# Patient Record
Sex: Female | Born: 1965 | Race: White | Hispanic: No
Health system: Southern US, Community
[De-identification: ages and names within clinical notes are randomized; demographics above are authoritative.]

## PROBLEM LIST (undated history)

## (undated) DIAGNOSIS — M545 Low back pain, unspecified: Secondary | ICD-10-CM

## (undated) DIAGNOSIS — Z87898 Personal history of other specified conditions: Secondary | ICD-10-CM

## (undated) DIAGNOSIS — F17200 Nicotine dependence, unspecified, uncomplicated: Secondary | ICD-10-CM

## (undated) DIAGNOSIS — R928 Other abnormal and inconclusive findings on diagnostic imaging of breast: Secondary | ICD-10-CM

## (undated) DIAGNOSIS — Z1211 Encounter for screening for malignant neoplasm of colon: Secondary | ICD-10-CM

## (undated) DIAGNOSIS — F909 Attention-deficit hyperactivity disorder, unspecified type: Secondary | ICD-10-CM

## (undated) DIAGNOSIS — F32A Depression, unspecified: Secondary | ICD-10-CM

## (undated) DIAGNOSIS — R0789 Other chest pain: Secondary | ICD-10-CM

## (undated) DIAGNOSIS — F329 Major depressive disorder, single episode, unspecified: Secondary | ICD-10-CM

## (undated) DIAGNOSIS — F1020 Alcohol dependence, uncomplicated: Secondary | ICD-10-CM

## (undated) DIAGNOSIS — Z9289 Personal history of other medical treatment: Secondary | ICD-10-CM

## (undated) DIAGNOSIS — F419 Anxiety disorder, unspecified: Secondary | ICD-10-CM

## (undated) HISTORY — DX: Attention-deficit hyperactivity disorder, unspecified type: F90.9

## (undated) HISTORY — DX: Depression, unspecified: F32.A

## (undated) HISTORY — DX: Nicotine dependence, unspecified, uncomplicated: F17.200

## (undated) HISTORY — DX: Personal history of other specified conditions: Z87.898

## (undated) HISTORY — DX: Personal history of other medical treatment: Z92.89

## (undated) HISTORY — DX: Other chest pain: R07.89

## (undated) HISTORY — DX: Major depressive disorder, single episode, unspecified: F32.9

## (undated) HISTORY — DX: Anxiety disorder, unspecified: F41.9

## (undated) HISTORY — DX: Low back pain, unspecified: M54.50

## (undated) HISTORY — DX: Other abnormal and inconclusive findings on diagnostic imaging of breast: R92.8

## (undated) HISTORY — DX: Low back pain: M54.5

## (undated) HISTORY — DX: Encounter for screening for malignant neoplasm of colon: Z12.11

## (undated) HISTORY — DX: Alcohol dependence, uncomplicated: F10.20

---

## 1995-01-29 HISTORY — PX: BUNIONECTOMY: SHX129

## 2005-03-28 ENCOUNTER — Inpatient Hospital Stay (HOSPITAL_COMMUNITY): Admission: EM | Admit: 2005-03-28 | Discharge: 2005-04-01 | Payer: Self-pay | Admitting: Internal Medicine

## 2005-11-29 ENCOUNTER — Inpatient Hospital Stay (HOSPITAL_COMMUNITY): Admission: AD | Admit: 2005-11-29 | Discharge: 2005-12-01 | Payer: Self-pay | Admitting: Psychiatry

## 2005-11-29 ENCOUNTER — Ambulatory Visit: Payer: Self-pay | Admitting: Psychiatry

## 2006-10-17 ENCOUNTER — Emergency Department (HOSPITAL_COMMUNITY): Admission: EM | Admit: 2006-10-17 | Discharge: 2006-10-17 | Payer: Self-pay | Admitting: Emergency Medicine

## 2006-11-29 DIAGNOSIS — Z87898 Personal history of other specified conditions: Secondary | ICD-10-CM

## 2006-11-29 HISTORY — DX: Personal history of other specified conditions: Z87.898

## 2009-06-28 DIAGNOSIS — R0789 Other chest pain: Secondary | ICD-10-CM

## 2009-06-28 DIAGNOSIS — Z9289 Personal history of other medical treatment: Secondary | ICD-10-CM

## 2009-06-28 HISTORY — PX: OTHER SURGICAL HISTORY: SHX169

## 2009-06-28 HISTORY — DX: Personal history of other medical treatment: Z92.89

## 2009-06-28 HISTORY — DX: Other chest pain: R07.89

## 2009-07-03 ENCOUNTER — Ambulatory Visit: Payer: Self-pay | Admitting: Occupational Medicine

## 2009-07-03 DIAGNOSIS — F329 Major depressive disorder, single episode, unspecified: Secondary | ICD-10-CM

## 2009-07-03 DIAGNOSIS — F32A Depression, unspecified: Secondary | ICD-10-CM | POA: Insufficient documentation

## 2009-07-03 DIAGNOSIS — R0602 Shortness of breath: Secondary | ICD-10-CM | POA: Insufficient documentation

## 2009-07-11 ENCOUNTER — Encounter: Payer: Self-pay | Admitting: Cardiology

## 2009-07-11 ENCOUNTER — Ambulatory Visit: Payer: Self-pay | Admitting: Cardiology

## 2009-07-11 DIAGNOSIS — R079 Chest pain, unspecified: Secondary | ICD-10-CM | POA: Insufficient documentation

## 2009-07-11 DIAGNOSIS — F17200 Nicotine dependence, unspecified, uncomplicated: Secondary | ICD-10-CM | POA: Insufficient documentation

## 2009-11-21 ENCOUNTER — Telehealth (INDEPENDENT_AMBULATORY_CARE_PROVIDER_SITE_OTHER): Payer: Self-pay | Admitting: *Deleted

## 2009-11-21 ENCOUNTER — Encounter: Payer: Self-pay | Admitting: Cardiology

## 2009-11-28 HISTORY — PX: TRANSTHORACIC ECHOCARDIOGRAM: SHX275

## 2009-12-06 ENCOUNTER — Ambulatory Visit: Payer: Self-pay

## 2009-12-06 ENCOUNTER — Encounter: Payer: Self-pay | Admitting: Cardiology

## 2009-12-06 ENCOUNTER — Ambulatory Visit (HOSPITAL_COMMUNITY): Admission: RE | Admit: 2009-12-06 | Discharge: 2009-12-06 | Payer: Self-pay | Admitting: Cardiology

## 2009-12-06 ENCOUNTER — Ambulatory Visit: Payer: Self-pay | Admitting: Cardiology

## 2009-12-12 ENCOUNTER — Telehealth: Payer: Self-pay | Admitting: Cardiology

## 2010-02-27 NOTE — Consult Note (Signed)
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

## 2010-02-27 NOTE — Assessment & Plan Note (Signed)
Summary: [facility]   Visit Type:  Initial Consult  CC:  Sob.  History of Present Illness: 45 yo female for evaluation of chest pain and dyspnea. No prior cardiac history. She states over the past 6 months she has had intermittent chest pain. It is described as a pressure in the substernal area without radiation. It can last 24 hours. It is not pleuritic, positional or related to food. It is not exertional. It typically occurs when she becomes angry. There is no associated symptoms. It resolved spontaneously. She also has feelings of dyspnea intermittently. These are unpredictable and occurs suddenly. She denies orthopnea, PND, pedal edema or syncope. She occasionally feels brief flutter but no sustained palpitations. Because of the above were asked to further evaluate.  Current Medications (verified): 1)  Prozac 40 Mg Caps (Fluoxetine Hcl) .... Once Daily 2)  Allegra 60 Mg Tabs (Fexofenadine Hcl) .... Once Daily  Allergies (verified): No Known Drug Allergies  Past History:  Past Medical History: Depression Previous alcoholism  Past Surgical History: Reviewed history from 07/03/2009 and no changes required. left foot- bunion removed  Family History: Reviewed history from 07/03/2009 and no changes required. mitral valve prolapse- mother Father with CABG at age 10  Social History: Reviewed history from 07/03/2009 and no changes required. Current Smoker 1/2 PPD X 20 Alcohol use-previous Drug use-no Married  Full Time 2 sons  Review of Systems       no fevers or chills, productive cough, hemoptysis, dysphasia, odynophagia, melena, hematochezia, dysuria, hematuria, rash, seizure activity, orthopnea, PND, pedal edema, claudication. Remaining systems are negative.   Vital Signs:  Patient profile:   45 year old female Height:      63 inches Weight:      113.75 pounds BMI:     20.22 Pulse rate:   91 / minute Pulse rhythm:   regular Resp:     18 per minute BP  sitting:   95 / 65  (right arm) Cuff size:   regular  Vitals Entered By: Vikki Ports (July 11, 2009 4:33 PM)  Physical Exam  General:  Well developed/well nourished in NAD Skin warm/dry; tatoos Patient not depressed No peripheral clubbing Back-normal HEENT-normal/normal eyelids Neck supple/normal carotid upstroke bilaterally; no bruits; no JVD; no thyromegaly chest - CTA/ normal expansion CV - RRR/normal S1 and S2; no murmurs, rubs or gallops;  PMI nondisplaced Abdomen -NT/ND, no HSM, no mass, + bowel sounds, no bruit 2+ femoral pulses, no bruits Ext-no edema, chords, 2+ DP Neuro-grossly nonfocal     EKG  Procedure date:  07/03/2009  Findings:      Sinus rhythm with normal axis. Probable lead reversal V1 and V2. No ST changes.  Impression & Recommendations:  Problem # 1:  CHEST PAIN UNSPECIFIED (ICD-786.50) Symptoms atypical. We'll schedule stress echocardiogram for risk stratification. If normal no further workup.  Problem # 2:  SHORTNESS OF BREATH (ICD-786.05) Schedule stress echocardiogram to quantify of the function as well. Orders: Stress Echo (Stress Echo)  Problem # 3:  TOBACCO ABUSE (ICD-305.1) Patient counseled on discontinuing.  Patient Instructions: 1)  Your physician recommends that you schedule a follow-up appointment in: AS NEEDED PENDING TEST RESULTS 2)  Your physician has requested that you have a stress echocardiogram. For further information please visit [email or web address]  Please follow instruction sheet as given.

## 2010-02-27 NOTE — Assessment & Plan Note (Signed)
Summary: SOB/CHEST DISCOMFORT   Vital Signs:  Patient Profile:   45 Years Old Female CC:      SOB, left shoulder/arm heaviness, dizziness Height:     63 inches Weight:      120 pounds O2 Sat:      98 % O2 treatment:    Room Air Temp:     97.0 degrees F oral Pulse rate:   99 / minute Resp:     16 per minute BP sitting:   115 / 72  (right arm) Cuff size:   regular  Pt. in pain?   yes    Location:   left shoulder    Type:       heaviness  Vitals Entered By: Lajean Saver RN (July 03, 2009 10:50 AM)                   Updated Prior Medication List: PROZAC 40 MG CAPS (FLUOXETINE HCL) once daily ALLEGRA 60 MG TABS (FEXOFENADINE HCL) once daily  Current Allergies: No known allergies History of Present Illness Chief Complaint: SOB, left shoulder/arm heaviness, dizziness History of Present Illness: 45yo WF with SOB intermittantly for the last few weeks and she is starting to get concerned.  Also with bilateral finger tingling for a few months and left shoulder pain for 2+ weeks.  No jaw pain or other radiation of pain.  Risk factors include 1/2ppd for 20 years and maternal hx of MVP.  She is also on Prozac for the last 10 years for anxiety and thinks that these symptoms may be due to stress at work over the last few months but she is unsure.  No true CP, but she thinks she gets more SOB than she should be.  Of note, she also had a normal Echo  ~10 years ago and normal recent blood work.    REVIEW OF SYSTEMS Constitutional Symptoms       Complains of fatigue.     Denies fever, chills, night sweats, weight loss, and weight gain.      Comments: dizziness Eyes       Denies change in vision, eye pain, eye discharge, glasses, contact lenses, and eye surgery. Ear/Nose/Throat/Mouth       Denies hearing loss/aids, change in hearing, ear pain, ear discharge, dizziness, frequent runny nose, frequent nose bleeds, sinus problems, sore throat, hoarseness, and tooth pain or bleeding.    Respiratory       Complains of shortness of breath.      Denies dry cough, productive cough, wheezing, asthma, bronchitis, and emphysema/COPD.  Cardiovascular       Denies murmurs, chest pain, and tires easily with exhertion.      Comments: dizziness, left shoulder heaviness   Gastrointestinal       Denies stomach pain, nausea/vomiting, diarrhea, constipation, blood in bowel movements, and indigestion. Genitourniary       Denies painful urination, kidney stones, and loss of urinary control. Neurological       Complains of tingling.      Denies paralysis, seizures, and fainting/blackouts.      Comments: left fingers Musculoskeletal       Denies muscle pain, joint pain, joint stiffness, decreased range of motion, redness, swelling, muscle weakness, and gout.  Skin       Denies bruising, unusual mles/lumps or sores, and hair/skin or nail changes.  Psych       Denies mood changes, temper/anger issues, anxiety/stress, speech problems, depression, and sleep problems. Other Comments: EKG  done and given to MD   Past History:  Past Medical History: Depression ECHO- normal  Past Surgical History: left foot- bunion removed  Family History: mitral valve prolapse- mother  Social History: Current Smoker 1/2 PPD X 20 Alcohol use-no Drug use-no Smoking Status:  current Drug Use:  no Physical Exam General appearance: well developed, well nourished, no acute distress Chest/Lungs: no rales, wheezes, or rhonchi bilateral, breath sounds equal without effort Heart: regular rate and  rhythm, no murmur Extremities: normal extremities Neurological: grossly intact and non-focal Skin: no obvious rashes or lesions Assessment New Problems: DEPRESSION (ICD-311) SHORTNESS OF BREATH (ICD-786.05) ANXIETY (ICD-300.00)   Plan New Orders: New Patient Level III [16109] Planning Comments:   Patient to monitor events in the next few days/weeks to see what exacerbates symptoms.  Will also schedule  outpatient cardiology visit for opinion on her shortness of breath and if it has a cardiac etiology.  Patient to go to the ER if worsening symptoms, severe chest pain or shortness of breath or worsening of symptoms.    The patient and/or caregiver has been counseled thoroughly with regard to medications prescribed including dosage, schedule, interactions, rationale for use, and possible side effects and they verbalize understanding.  Diagnoses and expected course of recovery discussed and will return if not improved as expected or if the condition worsens. Patient and/or caregiver verbalized understanding.   Orders Added: 1)  New Patient Level III [60454]

## 2010-02-27 NOTE — Progress Notes (Signed)
Summary: Echo results  Phone Note Call from Patient Call back at [phone]   Caller: Patient Summary of Call: Pt calling to get echo reuslts Initial call taken by: Judie Grieve,  December 12, 2009 9:09 AM  Follow-up for Phone Call        Phone Call Completed pt aware of stress test  and echo results. Follow-up by: Scherrie Bateman, LPN,  December 13, 2009 11:21 AM

## 2010-06-05 ENCOUNTER — Encounter: Payer: Self-pay | Admitting: Family Medicine

## 2010-06-05 ENCOUNTER — Ambulatory Visit (INDEPENDENT_AMBULATORY_CARE_PROVIDER_SITE_OTHER): Payer: BC Managed Care – PPO | Admitting: Family Medicine

## 2010-06-05 ENCOUNTER — Telehealth: Payer: Self-pay | Admitting: Family Medicine

## 2010-06-05 VITALS — BP 103/70 | HR 79 | Temp 98.1°F | Ht 62.0 in | Wt 114.0 lb

## 2010-06-05 DIAGNOSIS — R0600 Dyspnea, unspecified: Secondary | ICD-10-CM

## 2010-06-05 DIAGNOSIS — R0989 Other specified symptoms and signs involving the circulatory and respiratory systems: Secondary | ICD-10-CM

## 2010-06-05 DIAGNOSIS — F17200 Nicotine dependence, unspecified, uncomplicated: Secondary | ICD-10-CM

## 2010-06-05 DIAGNOSIS — M7918 Myalgia, other site: Secondary | ICD-10-CM | POA: Insufficient documentation

## 2010-06-05 DIAGNOSIS — R0609 Other forms of dyspnea: Secondary | ICD-10-CM

## 2010-06-05 DIAGNOSIS — IMO0001 Reserved for inherently not codable concepts without codable children: Secondary | ICD-10-CM

## 2010-06-05 DIAGNOSIS — R109 Unspecified abdominal pain: Secondary | ICD-10-CM

## 2010-06-05 LAB — POCT URINALYSIS DIPSTICK
Bilirubin, UA: NEGATIVE
Blood, UA: NEGATIVE
Glucose, UA: NEGATIVE
Nitrite, UA: NEGATIVE
Protein, UA: NEGATIVE
Spec Grav, UA: 1.05
pH, UA: 6.5

## 2010-06-05 NOTE — Telephone Encounter (Signed)
Pls request records from [facility] in [place] and from Dr. Christella Hartigan at [facility] in [place].  Thx

## 2010-06-05 NOTE — Patient Instructions (Signed)
Take ibuprofen 600mg  every 8 hours for the next 7d, then as needed. Apply heating pad for 20-30 min 1-2 times a day, stretch and remain fairly active. Call or return if the pain is worsening or not improved at all in 7-10d.

## 2010-06-05 NOTE — Assessment & Plan Note (Signed)
She is minimally symptomatic--mild DOE with exertion.  Spirometry normal today in office. Encouraged smoking cessation and she plans on proceeding with an employer-based cessation program that is free of charge.

## 2010-06-05 NOTE — Progress Notes (Signed)
Office Note 06/05/2010  CC:  Chief Complaint  Patient presents with  . Flank Pain    left side, x 2 weeks, worse yesterday    HPI:  Jamie Tucker is a 45 y.o. White female who is here to establish care, discuss left side pain. Patient's most recent primary MD: Brooks Sailors in [place], but majority of primary care has been done by her GYN--Dr. Christella Hartigan in [place]. Old records were not reviewed prior to or during today's visit. She complains of about 7 day history of constant pain in area near left iliac crest ("side"), with brief periods of acute worsening with bending or rotating her trunk.  No preceding injury recalled, although she now remembers that it began right after getting finished with a white water rafting trip.  The pain is 6/10 intensity, does not radiate, is not related to eating or BMs, and she has no nausea, fever, or appetite loss.  She does note some increase in urinary frequency lately but no dysuria, hematuria, or urgency.  She has some chronic mild stress incontinence--no changes lately.   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    Of note, she does want to quit smoking and has plans to take part in an employer based cessation program that is free.  However, she has a history of some mild/brief DOE and is interested in getting PFTs today.  Past Medical History  Diagnosis Date  . History of ETT 06/2009    Normal/No ischemia  . Atypical chest pain 06/2009  . Alcoholism     [facility] ICU admission 2007 for acute Alc intox,; also detox at [facility] 11/2005  . Tobacco dependence   . Depression   . Low back pain     Pt reports L/S x-ray that showed deg arth in the past    Past Surgical History  Procedure Date  . Exercise treadmill test 06/2009    No ischemia  . Transthoracic echocardiogram 11/2009    Normal except grade I diastolic  dysfunction    Family History  Problem Relation Age of Onset  . Mitral valve prolapse Mother   . Heart disease Mother     MVP  . Heart disease Father     CABG in his 61's, died age 52 of MI    History   Social History  . Marital Status: Married    Spouse Name: N/A    Number of Children: 2  . Years of Education: N/A   Occupational History  .  [employer]   Social History Main Topics  . Smoking status: Current Everyday Smoker -- 0.5 packs/day for 20 years  . Smokeless tobacco: Never Used  . Alcohol Use: No  . Drug Use: No  . Sexually Active: Not on file   Other Topics Concern  . 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    Outpatient Encounter Prescriptions as of 06/05/2010  Medication Sig Dispense Refill  . fexofenadine (ALLEGRA) 180 MG tablet Take 180 mg by mouth daily.        Marland Kitchen FLUoxetine (PROZAC) 40 MG capsule  Take 40 mg by mouth daily.        Marland Kitchen DISCONTD: fexofenadine (ALLEGRA) 60 MG tablet Take 60 mg by mouth daily.          No Known Allergies  ROS Review of Systems  Constitutional: Negative for fever, chills, appetite change and fatigue.  HENT: Negative for ear pain, congestion, sore throat, neck stiffness and dental problem.   Eyes: Negative for discharge, redness and visual disturbance.  Respiratory: Negative for cough, chest tightness, shortness of breath and wheezing.   Cardiovascular: Negative for chest pain, palpitations and leg swelling.  Gastrointestinal: Negative for nausea, vomiting, abdominal pain, diarrhea and blood in stool.  Genitourinary: Positive for frequency. Negative for dysuria, urgency, hematuria and difficulty urinating.  Musculoskeletal: Negative for myalgias, back pain, joint swelling and arthralgias.    Skin: Negative for pallor and rash.  Neurological: Negative for dizziness, speech difficulty, weakness and headaches.  Hematological: Negative for adenopathy. Does not bruise/bleed easily.  Psychiatric/Behavioral: Negative for confusion and sleep disturbance. The patient is not nervous/anxious.     PE; Blood pressure 103/70, pulse 79, temperature 98.1 F (36.7 C), temperature source Oral, height 5\' 2"  (1.575 m), weight 114 lb (51.71 kg), last menstrual period 05/13/2010, SpO2 99.00%. VITALS: Blood pressure 103/70, pulse 79, temperature 98.1 F (36.7 C), temperature source Oral, height 5\' 2"  (1.575 m), weight 114 lb (51.71 kg), last menstrual period 05/13/2010, SpO2 99.00%. Gen: alert, well appearing. HEENT: eyes without swelling, eryth, or drainage. Ear canals clear, TM's without erythema or loss of landmarks. Nose: clear.  Throat: no swelling, erythema, or exudate. Neck: no adenopathy, thyromegaly, or tenderness. Lungs: CTA bilat, nonlabored resps.  CV: RRR, no m/r/g Abd: soft, NT. EXT: no edema BACK: nontender, no deformity.  Mild discomfort to palpation over left iliac crest --soft tissues, not the crest itself.  No CVA tenderness.  HIP ROM intact without pain or stiffness.  No TTP of greater troch on left.  Mild discomfort with L spine extension, rotation, and lateral flexion to the right.  Straight leg raise negative.  LE strength and sensation intact bilat. Pertinent labs:  CC UA today was normal. Office spirometry today was NORMAL.  ASSESSMENT AND PLAN:   TOBACCO ABUSE She is minimally symptomatic--mild DOE with exertion.  Spirometry normal today in office. Encouraged smoking cessation and she plans on proceeding with an employer-based cessation program that is free of charge.  Myofascial muscle pain I reassured her that her symptoms and exam fit best with this (or possibly discogenic pain), and that a trial of NSAID is the best next step. Discussed ibuprofen 600mg  q8h x 7d,  then q8h prn.  Therapeutic expectations and side effect profile of medication discussed today.  Patient's questions answered. She'll call or return if no better in 10d or so, or if worsens before that. Will obtain old records.     Return if symptoms worsen or fail to improve.

## 2010-06-05 NOTE — Assessment & Plan Note (Signed)
I reassured her that her symptoms and exam fit best with this (or possibly discogenic pain), and that a trial of NSAID is the best next step. Discussed ibuprofen 600mg  q8h x 7d, then q8h prn.  Therapeutic expectations and side effect profile of medication discussed today.  Patient's questions answered. She'll call or return if no better in 10d or so, or if worsens before that. Will obtain old records.

## 2010-06-05 NOTE — Telephone Encounter (Signed)
Medical release forms faxed to [facility]

## 2010-06-07 ENCOUNTER — Encounter: Payer: Self-pay | Admitting: Family Medicine

## 2010-06-08 ENCOUNTER — Encounter: Payer: Self-pay | Admitting: Family Medicine

## 2010-06-15 NOTE — Discharge Summary (Signed)
Jamie Tucker, Jamie Tucker               ACCOUNT NO.:  [id number]   MEDICAL RECORD NO.:  [id number]          PATIENT TYPE:  INP   LOCATION:  5714                         FACILITY:  [facility]   PHYSICIAN:  Jonna L. Robb Matar, M.D.DATE OF BIRTH:  25-Dec-1965   DATE OF ADMISSION:  03/28/2005  DATE OF DISCHARGE:  04/01/2005                                 DISCHARGE SUMMARY   PRIMARY CARE PHYSICIAN:  Unassigned.   ALLERGIES:  None.   FINAL DIAGNOSES:  1.  Acute alcohol intoxication.  2.  Mild respiratory failure secondary to above.  3.  Chronic alcoholism.  4.  Hypokalemia.   HISTORY:  This 45 year old white female drinks every day, has occasional  pot.  Works at [employer].  Lives with her husband and two children.  She  had made plans to go to [facility] and, in fact, went over there after  having drunk a fifth right after work and became so intoxicated that she was  lethargic and having difficulty breathing.  When brought to the emergency  room, her alcohol level was 581.   HOSPITAL COURSE:  The patient was put in ICU for 24 hours as she was at very  high risk for needing to go on ventilatory support.  By the next day her  alcohol level was down to 280 and the patient was transferred to the regular  floor.  Her potassium of 3.2 was supplemented and she was put on an Ativan  detox protocol and received potassium supplements IM and full-load  magnesium.   Physical exam was unremarkable other than the decrease in mental status.   PLAN:  The patient will be discharged to [facility] on March 5.  Arrangements have been made.  Will continue her finishing up her Ativan  protocol there.      Jonna L. Robb Matar, M.D.  Electronically Signed     JLB/MEDQ  D:  03/31/2005  T:  04/01/2005  Job:  [id number]   cc:   [facility]

## 2010-06-15 NOTE — H&P (Signed)
Jamie Tucker, RAJU               ACCOUNT NO.:  [id number]   MEDICAL RECORD NO.:  [id number]          PATIENT TYPE:  IPS   LOCATION:  0501                          FACILITY:  [facility]   PHYSICIAN:  Geoffery Lyons, M.D.      DATE OF BIRTH:  05-01-1965   DATE OF ADMISSION:  11/29/2005  DATE OF DISCHARGE:                         PSYCHIATRIC ADMISSION ASSESSMENT   IDENTIFYING INFORMATION:  This is a voluntary admission to the services of  Dr. Geoffery Lyons.  This is a 45 year old married white female.  Apparently,  she passed out on Halloween.  She had started drinking again the previous  Saturday and had not been taking her Prozac consistently.  Her first  admission for alcohol intoxication was March 28, 2005.  She presented to the  emergency department.  She was noted to have been drinking every day with  occasional marijuana.  She came in intoxicated.  She was lethargic and  having difficulty breathing.  Her admission at that time showed her alcohol  level to be 581.  She was put in the ICU as she was at very high risk for  needing ventilatory support.  The following day, her alcohol level had  decreased to 280 and she was transferred to the floor.  She left and was  immediately enrolled at [facility].  She was there until April 29, 2005  and states that she stayed sober for approximately 90 days.  Apparently,  since then, she has had some alcohol intake but last Saturday she began  drinking heavily.  Apparently, her mother's stomach cancer has reappeared.  She was worried, etc.  Her alcohol level on admission was 378.  She had no  other abnormal lab values and she is here for detoxification.   SOCIAL HISTORY:  She is a high Garment/textile technologist in 1986.  She has been  married once.  She has a 48-year-old son and an 74-month-old son.  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   FAMILY HISTORY:  Her maternal grandfather was alcoholic and her  maternal  aunt was also an alcoholic.   ALCOHOL/DRUG HISTORY:  At age 38, she began social drinking.  She states  that three years ago, for reasons unknown to her, her alcohol intake  increased.  She states approximately five years ago she noticed that she was  more emotional, edgy, irritable and was started on Prozac.   PRIMARY CARE PHYSICIAN:  Dr. Christella Hartigan.   MEDICAL PROBLEMS:  No problems.   MEDICATIONS:  Prozac 20 mg, Allegra q.d. for allergies.   ALLERGIES:  No known drug allergies.   PHYSICAL EXAMINATION:  As already stated, her alcohol level was 378 in the  ED.  She was medically cleared.  Her vital signs on admission show that she  is 63 inches tall.  She weighs 110 pounds.  Her temperature is 97.2, blood  pressure 121/83, respirations 20 and pulse 96.  She has some old scars on  her right arm from cooking injuries.   MENTAL STATUS EXAM:  Today, she  is alert and oriented x3.  She is casually  groomed and dressed, adequately nourished.  Her speech is not pressured.  Her mood is appropriate to the situation.  Her thought processes are clear,  rational and goal-oriented.  Judgment and insight are intact.  Concentration  and memory are intact.  Intelligence is at least average.  She specifically  denies suicidal or homicidal ideation.  She denies auditory or visual  hallucinations.  In talking with her, a Mood Disorders Questionnaire was  administered and it is suggestive for an underlying mood disorder.   DIAGNOSES:  AXIS I:  Alcohol dependence.  Depression and anxiety, treated  with Prozac for five years.  Rule out mood disorder.  AXIS II:  Deferred.  AXIS III:  None.  AXIS IV:  Severe.  She did have a DWI in September of 2006.  She lost her  license in May for one year.  AXIS V:  50.   PLAN:  To admit for safety and stabilization, to help her safely withdrawal.  Towards that end, she was put on the low dose Librium protocol.  She has not  been taking her Prozac on a  regular basis.  She will discuss with Dr. Katrinka Blazing  whether she needs to take Campral, Lamictal and the patient, herself, asked  about Antabuse.  She does have an AA sponsor and can be followed as an  outpatient.      Mickie Leonarda Salon, P.A.-C.      Geoffery Lyons, M.D.  Electronically Signed    MD/MEDQ  D:  11/30/2005  T:  11/30/2005  Job:  [id number]

## 2010-06-15 NOTE — Discharge Summary (Signed)
Jamie Tucker, Jamie Tucker               ACCOUNT NO.:  [id number]   MEDICAL RECORD NO.:  [id number]          PATIENT TYPE:  IPS   LOCATION:  0501                          FACILITY:  [facility]   PHYSICIAN:  Geoffery Lyons, M.D.      DATE OF BIRTH:  08/20/1965   DATE OF ADMISSION:  11/29/2005  DATE OF DISCHARGE:  12/01/2005                               DISCHARGE SUMMARY   CHIEF COMPLAINT AND PRESENT ILLNESS:  This was the first admission to  [facility] for this 45 year old married white female.  She passed out on Halloween.  Started drinking again the previous  Saturday.  Not been taking Prozac consistently.  Had been drinking every  day with occasional marijuana use and daily she was intoxicated.  She  was lethargic and having difficulty breathing.  Alcohol level was 581.  She was put in the ICU.  The next day, alcohol level was 280.  Transferred to the floor.  She left and was immediately enrolled at  [facility].  She was there until April 29, 2005.  This episode of  intoxication was March 28, 2005.  Stayed sober for 90 days.  Since then,  she has had alcohol occasionally.  Lost father as she began drinking  heavily.  Trigger was her mother's stomach cancer reappeared.  She was  worried.   PAST PSYCHIATRIC HISTORY:  She had been in [facility] once she was  found to have the alcohol level of 581 back in March.   MEDICAL HISTORY:  Noncontributory.   MEDICATIONS:  Prozac 20 mg per day, Allegra for allergies.   PHYSICAL EXAMINATION:  Performed and failed to show any acute findings.   LABORATORY DATA:  Sodium 140, potassium 3.5, glucose 107, BUN 11,  creatinine 0.6.  Liver enzymes with SGOT 24, SGPT 18.  CBC with white  blood cells 8.8, hemoglobin 13.6.  Drug screening negative for  substances of abuse.   MENTAL STATUS EXAM:  Alert, cooperative female.  Casually groomed and  dressed.  Speech was normal in rate, tempo and production.  Mood  anxious.  Affect anxious.   Thought processes logical, clear and goal-  oriented.  No delusions.  No suicidal or homicidal ideation.  No  hallucinations.  Cognition was well-preserved.   ADMISSION DIAGNOSES:  AXIS I:  Alcohol dependence.  Depressive disorder  not otherwise specified.  AXIS II:  No diagnosis.  AXIS III:  No diagnosis.  AXIS IV:  Moderate.  AXIS V:  GAF upon admission 35-40; highest GAF in the last year 65-70.   HOSPITAL COURSE:  She was admitted.  She was started in individual and  group psychotherapy.  She was detoxified with Librium.  She was placed  on Campral.  She endorsed, as already stated, that she was in [facility] for 30 days, 60 days abstinence, relapsed.  Very upset.  Worse  weekend, vodka, feeling miserable after she drank.  She apparently,  seven years ago, had a depressive episode, was irritable, labile,  emotional.  Started drinking when she was 19.  Occasional social  drinker.  Three  years ago, switched to every day with increased use,  then switched to vodka, drinking a fifth with tremors and nausea.  Family history of addictions.  There was a family session with the  husband.  They addressed relapse prevention.  They talked about her use  and the husband's concern that she was not going to be compliant with  treatment.  On the other hand, she claimed that she was going to change  her lifestyle and was going to work on long-term abstinence.  By  December 01, 2005, she was in full contact with reality.  There were no  overt symptoms of withdrawal.  She was committed to abstaining.  She was  willing to pursue treatment, see a counselor, go to [facility].  We went  ahead and discharged to outpatient follow-up.   DISCHARGE DIAGNOSES:  AXIS I:  Alcohol dependence.  Depressive disorder  not otherwise specified.  AXIS II:  No diagnosis.  AXIS III:  No diagnosis.  AXIS IV:  Moderate.  AXIS V:  GAF upon discharge 50-55.   DISCHARGE MEDICATIONS:  1. Campral 333 mg, 2 tabs three  times a day.  2. Lamictal 25 mg per day.  3. Antabuse 250 mg per day.   FOLLOWUP:  [facility].      Geoffery Lyons, M.D.  Electronically Signed     IL/MEDQ  D:  12/30/2005  T:  12/31/2005  Job:  [id number]

## 2010-06-15 NOTE — H&P (Signed)
Jamie Tucker, Jamie Tucker               ACCOUNT NO.:  [id number]   MEDICAL RECORD NO.:  [id number]          PATIENT TYPE:  INP   LOCATION:  2302                         FACILITY:  [facility]   PHYSICIAN:  Renato Battles, M.D.     DATE OF BIRTH:  04/02/1965   DATE OF ADMISSION:  03/28/2005  DATE OF DISCHARGE:                                HISTORY & PHYSICAL   PRIMARY CARE PHYSICIAN:  Unassigned.   REASON FOR ADMISSION:  Acute alcohol intoxication.   HISTORY OF PRESENT ILLNESS:  The patient is a 45 year old white female who  has been drinking alcohol every day over the last few months. Today she was  at home drinking and her and her husband were preparing to take her to  [facility] for detoxification program, 28 days. While the husband was  preparing her gear to take her there she drank a whole fifth of vodka on top  of what she was drinking during the daytime. Upon arrival to [facility]  she was hard to arouse, she had frothy secretions coming from her mouth, and  she was saturating in the middle 80s, so she was sent to the emergency  department for evaluation. In the emergency department she was found to  protect her airways. However, she is stuporous and not responsive.   REVIEW OF SYSTEMS:  Unobtainable secondary to mental status.   PAST MEDICAL HISTORY:  None.   PAST SURGICAL HISTORY:  None.   FAMILY HISTORY:  None.   SOCIAL HISTORY:  She smokes one pack per day; she has been doing so for 10  years. Drinks on a daily basis with occasional marijuana use but no drug use  otherwise. She is a Teacher, adult education at [employer]. Lives with her husband  and two children. The younger of them is only 55 months old.   ALLERGIES:  No known drug allergies.   HOME MEDICATIONS:  None.   PHYSICAL EXAMINATION:  GENERAL:  The patient is semicomatose. Her only  response is opening her eyes to painful stimuli.  HEENT:  Head is atraumatic, normocephalic. Pupils equal, round, and reactive  to light and com.  NECK:  No lymphadenopathy, no thyromegaly, no JVD.  CHEST:  Clear to auscultation bilaterally. No wheezing, rales, or rhonchi.  HEART:  Regular rate and rhythm.  ABDOMEN:  Soft, nontender, not distended. No organomegaly. Normoactive bowel  sounds.  EXTREMITIES:  No cyanosis, edema, or clubbing.   STUDIES:  CBC showed normal white count, hemoglobin and platelets.   Electrolytes showed low potassium at 3.2, elevated glucose at 128, otherwise  normal.   Drug screen was negative. Alcohol level was very elevated at a whopping 581.  Pregnancy test was negative. Urinalysis was negative. ABG showed pH of 7.3,  pCO2 of 48, pO2 of 155.   ASSESSMENT:  1.  Acute alcohol intoxication.  2.  Chronic alcohol abuse.  3.  Hypokalemia.   PLAN:  1.  Admit to ICU for close monitoring.  2.  Frequent monitoring of the patient's mental and respiratory status to      detect any immediate need  for intubation for airway protection.  3.  Start thiamine and folate IV.  4.  Start IV fluids.  5.  Replace potassium via IV fluids.  6.  Check magnesium level and liver functions.  7.  Consult chemical dependence nurse for detoxification options.      Renato Battles, M.D.  Electronically Signed     SA/MEDQ  D:  03/28/2005  T:  03/29/2005  Job:  [id number]

## 2010-10-02 ENCOUNTER — Encounter: Payer: Self-pay | Admitting: Family Medicine

## 2010-10-02 ENCOUNTER — Ambulatory Visit (INDEPENDENT_AMBULATORY_CARE_PROVIDER_SITE_OTHER): Payer: BC Managed Care – PPO | Admitting: Family Medicine

## 2010-10-02 DIAGNOSIS — R5383 Other fatigue: Secondary | ICD-10-CM | POA: Insufficient documentation

## 2010-10-02 DIAGNOSIS — R5381 Other malaise: Secondary | ICD-10-CM

## 2010-10-02 DIAGNOSIS — R05 Cough: Secondary | ICD-10-CM

## 2010-10-02 DIAGNOSIS — R059 Cough, unspecified: Secondary | ICD-10-CM

## 2010-10-02 LAB — COMPREHENSIVE METABOLIC PANEL
ALT: 11 U/L (ref 0–35)
AST: 15 U/L (ref 0–37)
Albumin: 4.4 g/dL (ref 3.5–5.2)
CO2: 23 mEq/L (ref 19–32)
Calcium: 9.8 mg/dL (ref 8.4–10.5)
Chloride: 105 mEq/L (ref 96–112)
Creat: 0.62 mg/dL (ref 0.50–1.10)
Potassium: 4.4 mEq/L (ref 3.5–5.3)
Sodium: 137 mEq/L (ref 135–145)

## 2010-10-02 NOTE — Assessment & Plan Note (Signed)
Will check CBC, TSH, CMET, ESR. Her husband will observe for any sleep apnea. Recommended decreasing caffeine intake, start exercising, stop smoking.

## 2010-10-02 NOTE — Progress Notes (Signed)
OFFICE VISIT  10/02/2010   CC:  Chief Complaint  Patient presents with  . Sore Throat    x 4 days, fatigue     HPI:    Patient is a 45 y.o. Caucasian female who presents for cough and fatigue. Fatigue has been present for years, some excessive daytime sleepiness but no known snoring or apneic spells. Worse fatigue last 4d, with onset of clear runny nose, some cough and feeling of chest tightness/congestion, and forehead/retroorbital HAs. Some vague pain in neck around submandibular glands bilat, without ST or anterior neck pain. No rash, no fever, no myalgias. She has long hx of intermittent lumbar back pain but this hasn't bothered her much lately.  No weight loss. Question of hx of anemia in distant past, was told to take OTC MVI once daily and then never went back for recheck of these labs (done via dermatologist per pt report).  Says menses are regular and NOT heavy, actually getting lighter.  Past Medical History  Diagnosis Date  . History of ETT 06/2009    Normal/No ischemia  . Atypical chest pain 06/2009  . Alcoholism     [facility] ICU admission 2007 for acute Alc intox,; also detox at [facility] 11/2005  . Tobacco dependence   . Depression   . Low back pain     Pt reports L/S x-ray that showed deg arth in the past  . History of mammogram     Normal, most recent 09/2007 (through her OB/GYN)  . History of abnormal Pap smear 11/2006    ASC-US, High risk HPV negative.  Follow up paps normal.    Past Surgical History  Procedure Date  . Exercise treadmill test 06/2009    No ischemia  . Transthoracic echocardiogram 11/2009    Normal except grade I diastolic dysfunction  . Bunionectomy 1997    Left foot    Outpatient Prescriptions Prior to Visit  Medication Sig Dispense Refill  . fexofenadine (ALLEGRA) 180 MG tablet Take 180 mg by mouth daily.        Marland Kitchen FLUoxetine (PROZAC) 40 MG capsule Take 40 mg by mouth daily.        L-methylfolate qd  No Known Allergies  ROS As per  HPI  PE: Blood pressure 102/67, pulse 80, temperature 98.2 F (36.8 C), temperature source Oral, weight 113 lb (51.256 kg). VS: noted--normal. Gen: alert, NAD, NONTOXIC APPEARING. HEENT: eyes without injection, drainage, or swelling.  Ears: EACs clear, TMs with normal light reflex and landmarks.  Nose: Clear rhinorrhea, with some dried, crusty exudate adherent to mildly injected mucosa.  No purulent d/c.  No paranasal sinus TTP.  No facial swelling.  Throat and mouth without focal lesion.  No pharyngial swelling, erythema, or exudate.   Neck: supple, no LAD.  No mass, no TM. LUNGS: CTA bilat, nonlabored resps.   CV: RRR, no m/r/g. EXT: no c/c/e SKIN: no rash  LABS:  none  IMPRESSION AND PLAN:  Cough URI + mild smoker's bronchitis. Discussed smoking cessation, symptomatic care. With hx of fatigue will check CXR--ordered today. PFTs last visit were fine.  Fatigue Will check CBC, TSH, CMET, ESR. Her husband will observe for any sleep apnea. Recommended decreasing caffeine intake, start exercising, stop smoking.     FOLLOW UP: Return in about 6 weeks (around 11/13/2010).

## 2010-10-02 NOTE — Assessment & Plan Note (Signed)
URI + mild smoker's bronchitis. Discussed smoking cessation, symptomatic care. With hx of fatigue will check CXR--ordered today. PFTs last visit were fine.

## 2010-10-03 LAB — CBC WITH DIFFERENTIAL/PLATELET
Eosinophils Absolute: 0.3 10*3/uL (ref 0.0–0.7)
HCT: 38.4 % (ref 36.0–46.0)
Hemoglobin: 12.6 g/dL (ref 12.0–15.0)
Lymphocytes Relative: 35 % (ref 12–46)
Lymphs Abs: 2.4 10*3/uL (ref 0.7–4.0)
MCH: 29.9 pg (ref 26.0–34.0)
MCHC: 32.8 g/dL (ref 30.0–36.0)
Monocytes Relative: 8 % (ref 3–12)
Neutrophils Relative %: 52 % (ref 43–77)

## 2010-11-08 LAB — DIFFERENTIAL
Basophils Absolute: 0
Eosinophils Absolute: 0
Eosinophils Relative: 0
Lymphocytes Relative: 36
Lymphs Abs: 4.2 — ABNORMAL HIGH
Monocytes Absolute: 0.5
Monocytes Relative: 5
Neutro Abs: 6.7

## 2010-11-08 LAB — CBC
HCT: 38.9
Hemoglobin: 13.4
WBC: 11.4 — ABNORMAL HIGH

## 2010-11-08 LAB — RAPID URINE DRUG SCREEN, HOSP PERFORMED
Amphetamines: NOT DETECTED
Barbiturates: NOT DETECTED
Cocaine: NOT DETECTED
Opiates: NOT DETECTED
Tetrahydrocannabinol: NOT DETECTED

## 2010-11-08 LAB — BASIC METABOLIC PANEL
BUN: 16
GFR calc Af Amer: >60
Potassium: 3.8

## 2010-11-13 ENCOUNTER — Ambulatory Visit: Payer: BC Managed Care – PPO | Admitting: Family Medicine

## 2010-11-14 ENCOUNTER — Ambulatory Visit (HOSPITAL_BASED_OUTPATIENT_CLINIC_OR_DEPARTMENT_OTHER)
Admission: RE | Admit: 2010-11-14 | Discharge: 2010-11-14 | Disposition: A | Discharge status: Home / Self Care | Payer: BC Managed Care – PPO | Source: Ambulatory Visit | Attending: Family Medicine | Admitting: Family Medicine

## 2010-11-14 DIAGNOSIS — R5381 Other malaise: Secondary | ICD-10-CM | POA: Insufficient documentation

## 2010-11-14 DIAGNOSIS — R059 Cough, unspecified: Secondary | ICD-10-CM

## 2010-11-14 DIAGNOSIS — R0789 Other chest pain: Secondary | ICD-10-CM

## 2010-11-14 DIAGNOSIS — R5383 Other fatigue: Secondary | ICD-10-CM

## 2010-11-14 DIAGNOSIS — R079 Chest pain, unspecified: Secondary | ICD-10-CM | POA: Insufficient documentation

## 2010-11-14 DIAGNOSIS — Z87891 Personal history of nicotine dependence: Secondary | ICD-10-CM | POA: Insufficient documentation

## 2010-11-14 DIAGNOSIS — R05 Cough: Secondary | ICD-10-CM

## 2010-11-19 ENCOUNTER — Ambulatory Visit (INDEPENDENT_AMBULATORY_CARE_PROVIDER_SITE_OTHER): Payer: BC Managed Care – PPO | Admitting: Family Medicine

## 2010-11-19 ENCOUNTER — Encounter: Payer: Self-pay | Admitting: Family Medicine

## 2010-11-19 VITALS — BP 110/78 | HR 70 | Temp 98.0°F | Ht 62.0 in | Wt 112.0 lb

## 2010-11-19 DIAGNOSIS — F329 Major depressive disorder, single episode, unspecified: Secondary | ICD-10-CM

## 2010-11-19 DIAGNOSIS — F3289 Other specified depressive episodes: Secondary | ICD-10-CM

## 2010-11-19 DIAGNOSIS — Z23 Encounter for immunization: Secondary | ICD-10-CM

## 2010-11-19 MED ORDER — VILAZODONE HCL 10 & 20 & 40 MG PO KIT: 1.0000 | PACK | Freq: Every day | ORAL | Status: DC

## 2010-11-19 NOTE — Progress Notes (Signed)
OFFICE NOTE  11/19/2010  CC:  Chief Complaint  Patient presents with  . Follow-up    chest xray, mood     HPI:   Patient is a 45 y.o. Caucasian female who is here for irritability and mood problems. Two month history of worsening irritability, depression, can't concentrate or multitask like she usually can, has no patience, has no interest in doing anything that is usually fun for her.  Says sleep is fine, appetite good/unchanged.  Energy down some.  Doesn't want to go around people. Denies panic or extreme anxiety.  No big life changes preceding all of this.  She has abstained from alcohol for 4 years now, still attends AA regularly, still with same support system (husband and 2 sons).  No risk taking behavior, no excessive talkativeness, no excessive energy, no decreased need for sleep. Has been on fluoxetine for the last 12 years for hx of depression.  Pertinent PMH:  Alcoholism Tobacco dependence--is currently quitting  MEDS;   Outpatient Prescriptions Prior to Visit  Medication Sig Dispense Refill  . fexofenadine (ALLEGRA) 180 MG tablet Take 180 mg by mouth daily.        Marland Kitchen FLUoxetine (PROZAC) 40 MG capsule Take 40 mg by mouth daily.        Marland Kitchen L-Methylfolate 15 MG TABS Take 1/2 tablet by mouth once daily         PE: Blood pressure 110/78, pulse 70, temperature 98 F (36.7 C), temperature source Oral, height 5\' 2"  (1.575 m), weight 112 lb (50.803 kg), SpO2 97.00%. Gen: Alert, well appearing.  Patient is oriented to person, place, time, and situation.  Affect is pleasant.  Thought and conversation are appropriate. Neck: supple, ROM full.   No lymphadenopathy, thyromegaly, or mass. Chest: symmetric expansion, nonlabored respirations.  Clear and equal breath sounds in all lung fields.   CV: RRR, no m/r/g.   Neuro: CN 2-12 intact bilaterally, strength 5/5 in proximal and distal upper extremities and lower extremities bilaterally.  No sensory deficits.  No tremor.  No  disdiadochokinesis.  No ataxia.  Upper extremity and lower extremity DTRs symmetric.  No pronator drift.   IMPRESSION AND PLAN:  DEPRESSION D/C fluoxetine. Start viibryd.  Starter pack given today.  Therapeutic expectations and side effect profile of medication discussed today.  Patient's questions answered.      FOLLOW UP:  Return in about 3 weeks (around 12/10/2010) for f/u depression.

## 2010-11-19 NOTE — Assessment & Plan Note (Signed)
D/C fluoxetine. Start viibryd.  Starter pack given today.  Therapeutic expectations and side effect profile of medication discussed today.  Patient's questions answered.

## 2010-12-10 ENCOUNTER — Ambulatory Visit: Payer: BC Managed Care – PPO | Admitting: Family Medicine

## 2010-12-11 ENCOUNTER — Ambulatory Visit (INDEPENDENT_AMBULATORY_CARE_PROVIDER_SITE_OTHER): Payer: BC Managed Care – PPO | Admitting: Family Medicine

## 2010-12-11 ENCOUNTER — Encounter: Payer: Self-pay | Admitting: Family Medicine

## 2010-12-11 VITALS — BP 102/68 | HR 68 | Temp 97.8°F | Ht 62.0 in | Wt 114.8 lb

## 2010-12-11 DIAGNOSIS — F3289 Other specified depressive episodes: Secondary | ICD-10-CM

## 2010-12-11 DIAGNOSIS — F329 Major depressive disorder, single episode, unspecified: Secondary | ICD-10-CM

## 2010-12-11 MED ORDER — VILAZODONE HCL 40 MG PO TABS: 1.0000 | ORAL_TABLET | Freq: Every day | ORAL | Status: DC

## 2010-12-11 NOTE — Progress Notes (Signed)
OFFICE NOTE  12/11/2010  CC:  Chief Complaint  Patient presents with  . Follow-up    depression     HPI:   Patient is a 45 y.o. Caucasian female who is here for 3 wk f/u for depression. Started viibryd 3 wks ago (starter pack), has titrated up appropriately to the 40mg  qd dose. Reports significant improvement, specifically remarks she is less irritable, less overwhelmed, has fewer crying spells, seems to enjoy her life more. ROS: Denies any side effects, including nausea or sedation.  No suicidal thoughts.  No HAs.  No palpitations.  No constip or diarrhea.  No rash.  Pertinent PMH:  Past Medical History  Diagnosis Date  . History of ETT 06/2009    Normal/No ischemia  . Atypical chest pain 06/2009  . Alcoholism     [facility] ICU admission 2007 for acute Alc intox,; also detox at [facility] 11/2005  . Tobacco dependence   . Depression   . Low back pain     Pt reports L/S x-ray that showed deg arth in the past  . History of mammogram     Normal, most recent 09/2007 (through her OB/GYN)  . History of abnormal Pap smear 11/2006    ASC-US, High risk HPV negative.  Follow up paps normal.   Past medical, surgical, and social history reviewed and there are no changes since their last office visit.  MEDS;   Outpatient Prescriptions Prior to Visit  Medication Sig Dispense Refill  . fexofenadine (ALLEGRA) 180 MG tablet Take 180 mg by mouth daily.        . Vilazodone HCl (VIIBRYD) 10 & 20 & 40 MG KIT Take 1 tablet by mouth daily.  1 kit  0  . FLUoxetine (PROZAC) 40 MG capsule Take 40 mg by mouth daily.        Marland Kitchen L-Methylfolate 15 MG TABS Take 1/2 tablet by mouth once daily        PE: Blood pressure 102/68, pulse 68, temperature 97.8 F (36.6 C), temperature source Oral, height 5\' 2"  (1.575 m), weight 114 lb 12.8 oz (52.073 kg), last menstrual period 12/06/2010, SpO2 97.00%. VITALS: Blood pressure 102/68, pulse 68, temperature 97.8 F (36.6 C), temperature source Oral, height 5\' 2"  (1.575 m),  weight 114 lb 12.8 oz (52.073 kg), last menstrual period 12/06/2010, SpO2 97.00%. Wt Readings from Last 2 Encounters:  12/11/10 114 lb 12.8 oz (52.073 kg)  11/19/10 112 lb (50.803 kg)    Gen: alert, oriented x 4, affect pleasant.  Lucid thinking and conversation noted.  Affect is pleasant. HEENT: PERRLA, EOMI.   Neck: no LAD, mass, or thyromegaly. CV: RRR, no m/r/g LUNGS: CTA bilat, nonlabored. NEURO: no tremor or tics noted on observation.  Coordination intact. CN 2-12 grossly intact bilaterally, strength 5/5 in all extremeties.  No ataxia.   IMPRESSION AND PLAN:  DEPRESSION Improved and doing well on viibryd w/out side effect at 40mg  dosing. Continue 40mg  qd, f/u in office in 6 wks. Gave viibryd rx savings card today, eRx sent to pharmacy.      FOLLOW UP:  Return in about 6 weeks (around 01/22/2011).

## 2010-12-11 NOTE — Assessment & Plan Note (Signed)
Improved and doing well on viibryd w/out side effect at 40mg  dosing. Continue 40mg  qd, f/u in office in 6 wks. Gave viibryd rx savings card today, eRx sent to pharmacy.

## 2010-12-27 ENCOUNTER — Other Ambulatory Visit: Payer: Self-pay | Admitting: *Deleted

## 2010-12-27 MED ORDER — VILAZODONE HCL 40 MG PO TABS: 1.0000 | ORAL_TABLET | Freq: Every day | ORAL | Status: DC

## 2010-12-27 NOTE — Telephone Encounter (Signed)
Faxed request for 90 day supply.  RX approved by Dr. Milinda Cave.  RX faxed.

## 2011-01-02 ENCOUNTER — Telehealth: Payer: Self-pay | Admitting: Family Medicine

## 2011-01-02 MED ORDER — CLONAZEPAM 1 MG PO TABS: ORAL_TABLET | ORAL | Status: DC

## 2011-01-02 NOTE — Telephone Encounter (Signed)
Clonazepam rx printed--PM

## 2011-01-02 NOTE — Telephone Encounter (Signed)
PC from pt.  Pt feels like she is having a nervous breakdown.  Some days she feels like Viibryd is working, but other days she can not handle the stress.  She still has irritability that is worse during her menses.  She began menses yesterday.  She also complains of night sweats (not nightly). Today she is working on a deadline and has people coming to her and began crying after several interruptions.  Jamie Tucker states this is not like her.  She has worked at this job for 26 years and this is the first time she has had this reaction.  Pt feels she can not multitask and handle the interruptions like she used to.   Pt is not having suicidal thoughts, she does not want to hurt herself or others. Wants to know if Viibryd is right for her.  Please advise.

## 2011-01-02 NOTE — Telephone Encounter (Signed)
Pt notified and RX called to [facility] at [facility] in [place].

## 2011-01-02 NOTE — Telephone Encounter (Signed)
Reassure her that this is not due to the viibryd.  Encourage her that this is the right medication for her. This sounds exclusively like hormonal effects on mood that commonly occur around the time of menses. I suggest she continue viibryd at 40mg  qd dose. I will rx her clonazepam (anti-anxiety med) to take on the days during her menstrual cycle when she consistently has problems.  I'll print rx--PM

## 2011-03-01 ENCOUNTER — Encounter: Payer: Self-pay | Admitting: Family Medicine

## 2011-03-01 ENCOUNTER — Ambulatory Visit (INDEPENDENT_AMBULATORY_CARE_PROVIDER_SITE_OTHER): Payer: BC Managed Care – PPO | Admitting: Family Medicine

## 2011-03-01 VITALS — BP 105/71 | HR 76 | Ht 62.0 in | Wt 113.0 lb

## 2011-03-01 DIAGNOSIS — F329 Major depressive disorder, single episode, unspecified: Secondary | ICD-10-CM

## 2011-03-01 DIAGNOSIS — F3289 Other specified depressive episodes: Secondary | ICD-10-CM

## 2011-03-01 MED ORDER — FLUOXETINE HCL 20 MG PO TABS: ORAL_TABLET | ORAL | Status: DC

## 2011-03-01 NOTE — Progress Notes (Signed)
OFFICE NOTE  03/01/2011  CC:  Chief Complaint  Patient presents with  . Follow-up    medication     HPI: Patient is a 46 y.o. Caucasian female who is here for f/u depression. After initial few weeks of improvement on viibryd back in October 2012, she has essentially still been struggling with sad/depressed mood, anhedonia, irritability, poor energy level, poor concentration. Clonazepam takes the edge off of her irritability but she hasn't taken it much. She is not drinking, says everything at work is fine, everything at home is fine.  No physical complaints. No suicidal or homicidal thinking.  She has still been able to work.  Pertinent PMH:  Depression Alcoholism: in remission for several years now Tobacco abuse  MEDS:  Outpatient Prescriptions Prior to Visit  Medication Sig Dispense Refill  . clonazePAM (KLONOPIN) 1 MG tablet 1-2 tabs po q12h prn anxiety/mood lability during menses  30 tablet  2  . fexofenadine (ALLEGRA) 180 MG tablet Take 180 mg by mouth daily.        . Vilazodone HCl (VIIBRYD) 40 MG TABS Take 1 tablet by mouth daily.  90 tablet  1    PE: Blood pressure 105/71, pulse 76, height 5\' 2"  (1.575 m), weight 113 lb (51.256 kg). Gen: Alert, well appearing.  Patient is oriented to person, place, time, and situation. Wt Readings from Last 2 Encounters:  03/01/11 113 lb (51.256 kg)  12/11/10 114 lb 12.8 oz (52.073 kg)    Gen: alert, oriented x 4, affect pleasant.  Lucid thinking and conversation noted. HEENT: PERRLA, EOMI.   Neck: no LAD, mass, or thyromegaly. CV: RRR, no m/r/g LUNGS: CTA bilat, nonlabored. NEURO: no tremor or tics noted on observation.  Coordination intact. CN 2-12 grossly intact bilaterally, strength 5/5 in all extremeties.  No ataxia.   IMPRESSION AND PLAN: Major depression, treatment failure with viibryd. Has hx of response to fluoxetine in the past at 40mg  dose but then "pooped out" and this resulted in our trial of viibrid. We  discussed options today and decided to ween off viibryd over the next 2 wks (20qd x7d, then 10qd x 7d,then stop) while starting fluoxetine again.  We'll start at 20mg  qd x7d, then two of the 20mg  tabs qd until I see her again in 3 wks. We'll be quick to push the dose up to 60mg  or 80mg  qd since she develeped the "poop out" effect in the past on 40mg  qd dosing. Therapeutic expectations and side effect profile of medication discussed today.  Patient's questions answered.   FOLLOW UP: 3 wks.

## 2011-03-01 NOTE — Patient Instructions (Signed)
Ween off viibryd: 20mg  once daily x 7d, then 10mg  once daily x 7d then stop. At the same time, start fluoxetine 20mg  tab: 1 tab daily x 7d, then 2 tabs daily until I see you again in office.

## 2011-03-29 ENCOUNTER — Ambulatory Visit: Payer: BC Managed Care – PPO | Admitting: Family Medicine

## 2011-04-01 ENCOUNTER — Encounter: Payer: Self-pay | Admitting: Family Medicine

## 2011-04-01 ENCOUNTER — Ambulatory Visit (INDEPENDENT_AMBULATORY_CARE_PROVIDER_SITE_OTHER): Payer: BC Managed Care – PPO | Admitting: Family Medicine

## 2011-04-01 DIAGNOSIS — F329 Major depressive disorder, single episode, unspecified: Secondary | ICD-10-CM

## 2011-04-01 DIAGNOSIS — F3289 Other specified depressive episodes: Secondary | ICD-10-CM

## 2011-04-01 DIAGNOSIS — F17200 Nicotine dependence, unspecified, uncomplicated: Secondary | ICD-10-CM

## 2011-04-01 MED ORDER — FLUOXETINE HCL 20 MG PO TABS: ORAL_TABLET | ORAL | Status: DC

## 2011-04-01 NOTE — Progress Notes (Signed)
OFFICE NOTE  04/05/2011  CC:  Chief Complaint  Patient presents with  . Depression    ? increase med dose     HPI: Patient is a 46 y.o. Caucasian female who is here for 1 month f/u for depression. Last visit weened her off viibryd while starting her back on her fluoxetine. Feeling somewhat improved.  Energy up, mood better, sleep better, less irritably and using clonazepam MUCH less--only when very stressed/anxious.   Pertinent PMH:  Past Medical History  Diagnosis Date  . History of ETT 06/2009    Normal/No ischemia  . Atypical chest pain 06/2009  . Alcoholism     [facility] ICU admission 2007 for acute Alc intox,; also detox at [facility] 11/2005  . Tobacco dependence   . Depression   . Low back pain     Pt reports L/S x-ray that showed deg arth in the past  . History of mammogram     Normal, most recent 09/2007 (through her OB/GYN)  . History of abnormal Pap smear 11/2006    ASC-US, High risk HPV negative.  Follow up paps normal.    MEDS:  Outpatient Prescriptions Prior to Visit  Medication Sig Dispense Refill  . clonazePAM (KLONOPIN) 1 MG tablet 1-2 tabs po q12h prn anxiety/mood lability during menses  30 tablet  2  . fexofenadine (ALLEGRA) 180 MG tablet Take 180 mg by mouth daily.        Marland Kitchen FLUoxetine (PROZAC) 20 MG tablet 1 tab po qd x 7d, then 2 tabs po qd  40 tablet  0    PE: Blood pressure 105/73, pulse 71, height 5\' 2"  (1.575 m), weight 111 lb (50.349 kg), last menstrual period 03/15/2011. Gen: Alert, well appearing.  Patient is oriented to person, place, time, and situation. Affect is pleasant, thought and speech are lucid.  No further exam.  IMPRESSION AND PLAN:  DEPRESSION Improved.  Tolerating fluoxetine 40mg  qd well.  She does feel like she needs to increase to 60mg  qd dosing, however, as we have discussed in the recent past. Will do this: increase to three of the 20mg  fluoxetine tabs qd.  Therapeutic expectations and side effect profile of medication discussed today.   Patient's questions answered.    TOBACCO ABUSE Cutting back.  Encouraged to continue cutting back more aggressively.     FOLLOW UP: 1 mo

## 2011-04-05 NOTE — Assessment & Plan Note (Signed)
Cutting back.  Encouraged to continue cutting back more aggressively.

## 2011-04-05 NOTE — Assessment & Plan Note (Addendum)
Improved.  Tolerating fluoxetine 40mg  qd well.  She does feel like she needs to increase to 60mg  qd dosing, however, as we have discussed in the recent past. Will do this: increase to three of the 20mg  fluoxetine tabs qd.  Therapeutic expectations and side effect profile of medication discussed today.  Patient's questions answered.

## 2011-06-12 ENCOUNTER — Other Ambulatory Visit: Payer: Self-pay | Admitting: *Deleted

## 2011-06-12 MED ORDER — FLUOXETINE HCL 20 MG PO TABS: 60.0000 mg | ORAL_TABLET | Freq: Every day | ORAL | Status: DC

## 2011-06-12 NOTE — Telephone Encounter (Signed)
Faxed refill request received from pharmacy for fluoxetine Last filled by MD on 04/01/11, #90 x 1 Last seen on 04/01/11 Follow up needed 04/2011, pt did not schedule appt.  PC to pt and advised due for a follow up prior to any additional refills.  She is agreeable, but unable to schedule anything today.  She will call back. 30 day supply sent to pharmacy.

## 2011-06-18 ENCOUNTER — Ambulatory Visit (INDEPENDENT_AMBULATORY_CARE_PROVIDER_SITE_OTHER): Payer: BC Managed Care – PPO | Admitting: Family Medicine

## 2011-06-18 ENCOUNTER — Encounter: Payer: Self-pay | Admitting: Family Medicine

## 2011-06-18 VITALS — BP 113/81 | HR 71 | Ht 62.0 in | Wt 108.0 lb

## 2011-06-18 DIAGNOSIS — F329 Major depressive disorder, single episode, unspecified: Secondary | ICD-10-CM

## 2011-06-18 DIAGNOSIS — F3289 Other specified depressive episodes: Secondary | ICD-10-CM

## 2011-06-18 MED ORDER — FLUOXETINE HCL 20 MG PO TABS: 60.0000 mg | ORAL_TABLET | Freq: Every day | ORAL | Status: DC

## 2011-06-18 MED ORDER — BUPROPION HCL ER (XL) 150 MG PO TB24: 150.0000 mg | ORAL_TABLET | Freq: Every day | ORAL | Status: DC

## 2011-06-18 NOTE — Progress Notes (Signed)
OFFICE NOTE  06/18/2011  CC:  Chief Complaint  Patient presents with  . Follow-up    Depression     HPI: Patient is a 46 y.o. Caucasian female who is here for 2 mo f/u depression/anxiety.  Feeling 75% improved, still having days where she feels no motivation or energy, gets irritable.  This is somewhat dependent on what's going on at work--[employer].  No panic attacks or significant anxiety periods.  Rarely takes any clonazepam.  Appetite good.  Currently she does no formal exercise but is very active in her duties at work.   Pertinent PMH:  Past Medical History  Diagnosis Date  . History of ETT 06/2009    Normal/No ischemia  . Atypical chest pain 06/2009  . Alcoholism     [facility] ICU admission 2007 for acute Alc intox,; also detox at [facility] 11/2005  . Tobacco dependence   . Depression   . Low back pain     Pt reports L/S x-ray that showed deg arth in the past  . History of mammogram     Normal, most recent 09/2007 (through her OB/GYN)  . History of abnormal Pap smear 11/2006    ASC-US, High risk HPV negative.  Follow up paps normal.   Past surgical, social, and family history reviewed and no changes noted since last office visit.  MEDS:  Outpatient Prescriptions Prior to Visit  Medication Sig Dispense Refill  . clindamycin (CLEOCIN T) 1 % external solution Apply topically as needed.      . clonazePAM (KLONOPIN) 1 MG tablet 1-2 tabs po q12h prn anxiety/mood lability during menses  30 tablet  2  . fexofenadine (ALLEGRA) 180 MG tablet Take 180 mg by mouth daily. itching      . FLUoxetine (PROZAC) 20 MG tablet Take 3 tablets (60 mg total) by mouth daily.  90 tablet  0  . sulfamethoxazole-trimethoprim (BACTRIM DS) 800-160 MG per tablet Take 1 tablet by mouth Twice daily.        PE: Blood pressure 113/81, pulse 71, height 5\' 2"  (1.575 m), weight 108 lb (48.988 kg). Wt Readings from Last 2 Encounters:  06/18/11 108 lb (48.988 kg)  04/01/11 111 lb (50.349 kg)    Gen: alert,  oriented x 4, affect pleasant.  Lucid thinking and conversation noted. HEENT: PERRLA, EOMI.   Neck: no LAD, mass, or thyromegaly. CV: RRR, no m/r/g LUNGS: CTA bilat, nonlabored. NEURO: no tremor or tics noted on observation.  Coordination intact. CN 2-12 grossly intact bilaterally, strength 5/5 in all extremeties.  No ataxia.  LABS: none today.  Recent:  Lab Results  Component Value Date   WBC 6.7 10/02/2010   HGB 12.6 10/02/2010   HCT 38.4 10/02/2010   MCV 91.2 10/02/2010   PLT 288 10/02/2010     Chemistry      Component Value Date/Time   NA 137 10/02/2010 1507   K 4.4 10/02/2010 1507   CL 105 10/02/2010 1507   CO2 23 10/02/2010 1507   BUN 15 10/02/2010 1507   CREATININE 0.62 10/02/2010 1507   CREATININE 0.69 10/17/2006 2019      Component Value Date/Time   CALCIUM 9.8 10/02/2010 1507   ALKPHOS 31* 10/02/2010 1507   AST 15 10/02/2010 1507   ALT 11 10/02/2010 1507   BILITOT 0.3 10/02/2010 1507     Lab Results  Component Value Date   TSH 1.907 10/02/2010     IMPRESSION AND PLAN:  DEPRESSION Continue fluoxetine at 60mg  qd dosing and  add wellbutrin XL 150mg  qd.  Therapeutic expectations and side effect profile of medication discussed today.  Patient's questions answered. F/u in office in 4-6 wks.  Encouraged pt to start exercising, encouraged her to STOP smoking.      FOLLOW UP:  4-6 wks.

## 2011-06-18 NOTE — Assessment & Plan Note (Signed)
Continue fluoxetine at 60mg  qd dosing and add wellbutrin XL 150mg  qd.  Therapeutic expectations and side effect profile of medication discussed today.  Patient's questions answered. F/u in office in 4-6 wks.  Encouraged pt to start exercising, encouraged her to STOP smoking.

## 2011-08-20 ENCOUNTER — Other Ambulatory Visit: Payer: Self-pay | Admitting: *Deleted

## 2011-08-20 MED ORDER — CLONAZEPAM 1 MG PO TABS: ORAL_TABLET | ORAL | Status: DC

## 2011-08-20 NOTE — Telephone Encounter (Signed)
Refill request for CLONAZEPAM  Last filled-01/02/11, #30 X 2 Last seen-06/18/11 Follow up -needed in 4-6 weeks due to adding wellbutrin.  No appts made. Please advise refill.

## 2011-08-20 NOTE — Telephone Encounter (Signed)
Rx printed for #30, no RF. Encourage her to make o/v appt to f/u depression and anxiety since we added a new med last visit in May.--thx

## 2011-08-20 NOTE — Telephone Encounter (Signed)
I have attempted to contact this patient by phone with the following results: left message to return my call on answering machine (mobile).  

## 2011-08-20 NOTE — Telephone Encounter (Signed)
RX faxed

## 2011-08-22 NOTE — Telephone Encounter (Signed)
RC from pt.  Advised she needs appt to follow up.  She is still taking new med and things are going well.  She will call back to schedule appt.

## 2011-09-20 ENCOUNTER — Other Ambulatory Visit: Payer: Self-pay | Admitting: *Deleted

## 2011-09-20 MED ORDER — BUPROPION HCL ER (XL) 150 MG PO TB24: 150.0000 mg | ORAL_TABLET | Freq: Every day | ORAL | Status: DC

## 2011-09-20 NOTE — Telephone Encounter (Signed)
Refill request for [facility] Last filled- 06/18/11, #30 x 1 Last seen- 06/18/11 Follow up - 4-6 weeks, no appt in system 30 day supply sent.  Message left for patient to make appt for more refills.

## 2011-11-05 ENCOUNTER — Other Ambulatory Visit: Payer: Self-pay | Admitting: *Deleted

## 2011-11-05 MED ORDER — CLONAZEPAM 1 MG PO TABS: ORAL_TABLET | ORAL | Status: DC

## 2011-11-05 NOTE — Telephone Encounter (Signed)
Refill request for CLONAZEPAM Last filled-08/20/11, #30 X 0 Last seen- 06/18/11 Follow up -  Is needed.  Pt is aware she needs appt.  She will call back to schedule.  Advised pt NO MORE refills until she schedules. Please advise if OK to refill.

## 2011-11-05 NOTE — Telephone Encounter (Signed)
RX faxed

## 2011-11-05 NOTE — Telephone Encounter (Signed)
#  30 clonazepam rx'd, with no additional RF's.  Agree with need to come in for routine f/u soon.-thx

## 2011-12-04 ENCOUNTER — Other Ambulatory Visit: Payer: Self-pay | Admitting: *Deleted

## 2011-12-04 MED ORDER — BUPROPION HCL ER (XL) 150 MG PO TB24: 150.0000 mg | ORAL_TABLET | Freq: Every day | ORAL | Status: DC

## 2011-12-04 NOTE — Telephone Encounter (Signed)
VM from pt stating she has appt on 11/13 and needs refill on wellbutrin prior to that time.  States she was told we did not have any available appt on Friday.   RC to patient.  Advised we have openings on Friday, but OK to wait until Wednesday also.  OK to call in meds.  Pt will keep appt on Wednesday. Last seen 06/18/11 Last filled 09/20/11 with note follow up needed. 30 day supply sent to pharmacy so pt will not have lapse prior to appt.

## 2011-12-11 ENCOUNTER — Ambulatory Visit: Payer: BC Managed Care – PPO | Admitting: Family Medicine

## 2011-12-13 ENCOUNTER — Encounter: Payer: Self-pay | Admitting: Family Medicine

## 2011-12-13 ENCOUNTER — Ambulatory Visit (INDEPENDENT_AMBULATORY_CARE_PROVIDER_SITE_OTHER): Payer: BC Managed Care – PPO | Admitting: Family Medicine

## 2011-12-13 VITALS — BP 117/80 | HR 67 | Ht 62.0 in | Wt 106.0 lb

## 2011-12-13 DIAGNOSIS — F329 Major depressive disorder, single episode, unspecified: Secondary | ICD-10-CM

## 2011-12-13 DIAGNOSIS — F3289 Other specified depressive episodes: Secondary | ICD-10-CM

## 2011-12-13 DIAGNOSIS — R0789 Other chest pain: Secondary | ICD-10-CM

## 2011-12-13 MED ORDER — CLONAZEPAM 1 MG PO TABS: ORAL_TABLET | ORAL | Status: DC

## 2011-12-13 MED ORDER — FLUOXETINE HCL 20 MG PO TABS: 60.0000 mg | ORAL_TABLET | Freq: Every day | ORAL | Status: DC

## 2011-12-13 MED ORDER — BUPROPION HCL ER (XL) 150 MG PO TB24: 150.0000 mg | ORAL_TABLET | Freq: Every day | ORAL | Status: DC

## 2011-12-13 NOTE — Assessment & Plan Note (Signed)
Problem stable.  Continue current medications and diet appropriate for this condition.  We have reviewed our general long term plan for this problem and also reviewed symptoms and signs that should prompt the patient to call or return to the office. RF's given today for fluoxetine and wellbutrin and xanax.

## 2011-12-13 NOTE — Assessment & Plan Note (Signed)
Doubt cardiopulmonary etiology. Suspect musculoskeletal etiology. However, with her smoking hx and 1 yr hx of this pain (onset ? After her last CXR 09/2010) will recheck plain films of chest. Encouraged smoking cessation.

## 2011-12-13 NOTE — Progress Notes (Signed)
OFFICE NOTE  12/13/2011  CC:  Chief Complaint  Patient presents with  . Follow-up    mood     HPI: Patient is a 46 y.o. Caucasian female who is here for 6 mo f/u depression/anxiety.   Says she is doing good, euthymic regarding mood.  Some stress/anxiety periods that respond well to prn clonazepam.   Still smoking.  Describes about 1 yr hx of intermittent, random chest pains on right mid/upper chest wall.  No associated sx's such as palpitations, SOB, diaphoresis, or dizziness.  It is moderate, described more as a discomfort, and she notes that it comes and goes spontaneously and is definitely not associated with stress or exertion.     Pertinent PMH:  Past Medical History  Diagnosis Date  . History of ETT 06/2009    Normal/No ischemia  . Atypical chest pain 06/2009  . Alcoholism     [facility] ICU admission 2007 for acute Alc intox,; also detox at [facility] 11/2005  . Tobacco dependence   . Depression   . Low back pain     Pt reports L/S x-ray that showed deg arth in the past  . History of mammogram     Normal, most recent 09/2007 (through her OB/GYN)  . History of abnormal Pap smear 11/2006    ASC-US, High risk HPV negative.  Follow up paps normal.    MEDS:  Outpatient Prescriptions Prior to Visit  Medication Sig Dispense Refill  . buPROPion (WELLBUTRIN XL) 150 MG 24 hr tablet Take 1 tablet (150 mg total) by mouth daily.  30 tablet  0  . clonazePAM (KLONOPIN) 1 MG tablet 1-2 tabs po q12h prn anxiety/mood lability during menses  30 tablet  0  . fexofenadine (ALLEGRA) 180 MG tablet Take 180 mg by mouth daily. itching      . FLUoxetine (PROZAC) 20 MG tablet Take 3 tablets (60 mg total) by mouth daily.  270 tablet  0  . minocycline (MINOCIN,DYNACIN) 100 MG capsule Take 1 tablet by mouth Twice daily.      . clindamycin (CLEOCIN T) 1 % external solution Apply topically as needed.      . fluocinonide cream (LIDEX) 0.05 % Apply topically as needed.       Last reviewed on 12/13/2011  9:47 AM  by Jeoffrey Massed, MD  PE: Blood pressure 117/80, pulse 67, height 5\' 2"  (1.575 m), weight 106 lb (48.081 kg). Gen: Alert, well appearing.  Patient is oriented to person, place, time, and situation. ENT: Eyes: no injection, icteris, swelling, or exudate.  EOMI, PERRLA. Nose: no drainage or turbinate edema/swelling.  No injection or focal lesion.   Neck - No masses or thyromegaly or limitation in range of motion CV: RRR, no m/r/g.   LUNGS: CTA bilat, nonlabored resps, good aeration in all lung fields.  Chest wall nontender.   IMPRESSION AND PLAN:  DEPRESSION Problem stable.  Continue current medications and diet appropriate for this condition.  We have reviewed our general long term plan for this problem and also reviewed symptoms and signs that should prompt the patient to call or return to the office. RF's given today for fluoxetine and wellbutrin and xanax.  Atypical chest pain Doubt cardiopulmonary etiology. Suspect musculoskeletal etiology. However, with her smoking hx and 1 yr hx of this pain (onset ? After her last CXR 09/2010) will recheck plain films of chest. Encouraged smoking cessation.   An After Visit Summary was printed and given to the patient.  FOLLOW UP: 6  mo

## 2012-04-17 ENCOUNTER — Telehealth: Payer: Self-pay

## 2012-04-17 ENCOUNTER — Other Ambulatory Visit: Payer: Self-pay | Admitting: Family Medicine

## 2012-04-17 MED ORDER — CLONAZEPAM 1 MG PO TABS: ORAL_TABLET | ORAL | Status: DC

## 2012-04-17 NOTE — Telephone Encounter (Signed)
Clonazepam Rx request (Last refill: 12/13/2011 #30x2) / AMT, CMA Please advise.

## 2012-04-17 NOTE — Telephone Encounter (Signed)
Rx faxed to pharmacy/SLS 

## 2012-04-17 NOTE — Telephone Encounter (Signed)
Rx printed and ready to be faxed.

## 2012-04-20 ENCOUNTER — Encounter: Payer: Self-pay | Admitting: Family Medicine

## 2012-04-20 ENCOUNTER — Ambulatory Visit (INDEPENDENT_AMBULATORY_CARE_PROVIDER_SITE_OTHER): Payer: Managed Care, Other (non HMO) | Admitting: Family Medicine

## 2012-04-20 VITALS — BP 110/73 | HR 62 | Temp 98.2°F | Resp 14 | Wt 109.0 lb

## 2012-04-20 DIAGNOSIS — F909 Attention-deficit hyperactivity disorder, unspecified type: Secondary | ICD-10-CM | POA: Insufficient documentation

## 2012-04-20 DIAGNOSIS — F3289 Other specified depressive episodes: Secondary | ICD-10-CM

## 2012-04-20 DIAGNOSIS — F329 Major depressive disorder, single episode, unspecified: Secondary | ICD-10-CM

## 2012-04-20 MED ORDER — LISDEXAMFETAMINE DIMESYLATE 20 MG PO CAPS: 20.0000 mg | ORAL_CAPSULE | ORAL | Status: DC

## 2012-04-20 NOTE — Assessment & Plan Note (Signed)
Start vyvanse 20mg  qd.  Increase to 40mg  qd after 5d if not feeling significant therapeutic effect. Therapeutic expectations and side effect profile of medication discussed today.  Patient's questions answered.

## 2012-04-20 NOTE — Assessment & Plan Note (Signed)
Continue paxil 60mg  qd but d/c wellbutrin. We'll monitor her response to vyvanse and see if we're able to ween paxil any but I suspect she'll need an antidepressant long term.

## 2012-04-20 NOTE — Progress Notes (Signed)
OFFICE NOTE 2 04/20/2012  CC:  Chief Complaint  Patient presents with  . Discuss Medications    Prozac and Wellbutrin not working, still irritable, just gets worse     HPI: Patient is a 47 y.o. Caucasian female who is here for 4 mo f/u for mood disorder and anxiety disorder. Says depression/anxiety still problematic, feels like she has struggled with ADHD all her life and it has not been addressed.  Describes chronic feeling of inner restlessness, always frustrated with too much to do and doesn't know where to start, lots of tasks go unfinished due to lack of organization, feels overwhelmed easily and occ has crying spells. Staying focused is extremely difficult.   Sleep is fine, appetite is good.  No SI or HI. Of note, she says she consistently takes her prozac daily but often misses doses of her wellbutrin.  Recently got a promotion at work so this is good but she feels more pressure now.  Pertinent PMH:  Past Medical History  Diagnosis Date  . History of ETT 06/2009    Normal/No ischemia  . Atypical chest pain 06/2009  . Alcoholism     [facility] ICU admission 2007 for acute Alc intox,; also detox at [facility] 11/2005  . Tobacco dependence   . Depression   . Low back pain     Pt reports L/S x-ray that showed deg arth in the past  . History of mammogram     Normal, most recent 09/2007 (through her OB/GYN)  . History of abnormal Pap smear 11/2006    ASC-US, High risk HPV negative.  Follow up paps normal.    MEDS:  Outpatient Prescriptions Prior to Visit  Medication Sig Dispense Refill  . buPROPion (WELLBUTRIN XL) 150 MG 24 hr tablet Take 1 tablet (150 mg total) by mouth daily.  30 tablet  6  . clindamycin (CLEOCIN T) 1 % external solution Apply topically as needed.      . clonazePAM (KLONOPIN) 1 MG tablet 1-2 tabs po q12h prn anxiety/mood lability during menses  30 tablet  2  . fexofenadine (ALLEGRA) 180 MG tablet Take 180 mg by mouth daily. itching      . fluocinonide cream (LIDEX)  0.05 % Apply topically as needed.      Marland Kitchen FLUoxetine (PROZAC) 20 MG tablet Take 3 tablets (60 mg total) by mouth daily.  90 tablet  6  . minocycline (MINOCIN,DYNACIN) 100 MG capsule Take 1 tablet by mouth Twice daily.       No facility-administered medications prior to visit.  Not taking minocin or cleocin as listed above.  PE: Blood pressure 110/73, pulse 62, temperature 98.2 F (36.8 C), temperature source Temporal, resp. rate 14, weight 109 lb (49.442 kg), SpO2 99.00%. Gen: Alert, well appearing.  Patient is oriented to person, place, time, and situation. ENT: Eyes: no injection, icteris, swelling, or exudate.  EOMI, PERRLA. Nose: no drainage or turbinate edema/swelling.  No injection or focal lesion.  Mouth: lips without lesion/swelling.   Oropharynx without erythema, exudate, or swelling.  Neck - No masses or thyromegaly or limitation in range of motion CV: RRR, no m/r/g.   LUNGS: CTA bilat, nonlabored resps, good aeration in all lung fields.  IMPRESSION AND PLAN:  Adult ADHD Start vyvanse 20mg  qd.  Increase to 40mg  qd after 5d if not feeling significant therapeutic effect. Therapeutic expectations and side effect profile of medication discussed today.  Patient's questions answered.   DEPRESSION Continue paxil 60mg  qd but d/c wellbutrin. We'll monitor  her response to vyvanse and see if we're able to ween paxil any but I suspect she'll need an antidepressant long term.   An After Visit Summary was printed and given to the patient.  FOLLOW UP:  21mo

## 2012-04-21 ENCOUNTER — Telehealth: Payer: Self-pay | Admitting: Family Medicine

## 2012-04-21 MED ORDER — LISDEXAMFETAMINE DIMESYLATE 20 MG PO CAPS: 20.0000 mg | ORAL_CAPSULE | ORAL | Status: DC

## 2012-04-21 NOTE — Telephone Encounter (Signed)
LMOM with contact name and number RE: new Rx & Rx discount card/paperwork ready for p/u M-f 8a-5p/SLS

## 2012-04-21 NOTE — Telephone Encounter (Signed)
LMOM with contact name and number for return call from pt RE: issues w/Rx [#30x0 print] & Rx discount card [expires 06.3.14] given at 03.24.14 OV/SLS  Called pharmacy to get clarification & more information as to what happened RE: matters mentioned above; Rx discount card has a stipulation that first use must be by 02.28.14 [fine print not listed by the expiration date, as would be expected]; therefore, pt could only afford to receive #2 pills of #30 Rx. Printed out new Rx for #28 to cover remainder of 03.24.14 prescription & provider will attempt to get discount coupon off online site. Will contact pt when all is ready for pick-up/SLS

## 2012-04-21 NOTE — Telephone Encounter (Signed)
Patient needs new Rx for Vyvanse sent to pharmacy, pharmacy let her get 2 pills yesterday. Patient also states the discount card was expired. Do we have any that aren't expired?

## 2012-05-26 ENCOUNTER — Other Ambulatory Visit: Payer: Self-pay | Admitting: Family Medicine

## 2012-05-26 ENCOUNTER — Other Ambulatory Visit: Payer: Self-pay | Admitting: *Deleted

## 2012-05-26 MED ORDER — LISDEXAMFETAMINE DIMESYLATE 20 MG PO CAPS: 20.0000 mg | ORAL_CAPSULE | ORAL | Status: DC

## 2012-05-26 NOTE — Progress Notes (Signed)
Orders only encounter

## 2012-05-26 NOTE — Telephone Encounter (Signed)
Duplicate request

## 2012-05-26 NOTE — Telephone Encounter (Signed)
Refill request for Vyvance 20 mg Last filled-04/21/12, #28 x0 Last seen-04/20/12 Follow up - none Please advise?

## 2012-05-26 NOTE — Telephone Encounter (Signed)
I printed rx with 1 mo supply that she can come pick up.  However, she was supposed to f/u 1 mo after last visit. She needs to f/u in office prior to any FURTHER RF's so we can monitor wt, bp, HR, and response to med/assess need for dose change, etc.

## 2012-05-27 NOTE — Telephone Encounter (Signed)
Left message cell advising patient about rx and f/u appointment.

## 2012-06-23 ENCOUNTER — Encounter: Payer: Self-pay | Admitting: Nurse Practitioner

## 2012-06-23 ENCOUNTER — Ambulatory Visit (INDEPENDENT_AMBULATORY_CARE_PROVIDER_SITE_OTHER): Payer: Managed Care, Other (non HMO) | Admitting: Nurse Practitioner

## 2012-06-23 VITALS — BP 94/62 | HR 74 | Temp 98.2°F | Resp 12

## 2012-06-23 DIAGNOSIS — F909 Attention-deficit hyperactivity disorder, unspecified type: Secondary | ICD-10-CM

## 2012-06-23 DIAGNOSIS — J029 Acute pharyngitis, unspecified: Secondary | ICD-10-CM

## 2012-06-23 MED ORDER — LISDEXAMFETAMINE DIMESYLATE 20 MG PO CAPS: 20.0000 mg | ORAL_CAPSULE | ORAL | Status: DC

## 2012-06-23 NOTE — Patient Instructions (Signed)
Your rapid test for strp throat is negative. We will send culture and call in antibiotic if it is positive for strep bacteria. This may be a viral sore throat that turns into a common cold. Take throat lozenges with benocaine for comfort. Ibuprophen will help also. We will call with your results. Feel better!

## 2012-06-23 NOTE — Progress Notes (Signed)
  Subjective:    Patient ID: Jamie Tucker, female    DOB: 1965-09-03, 47 y.o.   MRN: [id number]  HPI Comments: Also wants to follow up on vyvanse: pt states working well, no intolerable side effects of insomnia, loss of appetite, or palpitations. Using 1 tab daily. Would like to continue.   Sore Throat  This is a new problem. The current episode started yesterday. The problem has been gradually worsening. There has been no fever. The pain is at a severity of 5/10. Associated symptoms include abdominal pain (crampy few days ago). Pertinent negatives include no congestion, coughing, diarrhea, ear pain, headaches, neck pain, shortness of breath, swollen glands, trouble swallowing or vomiting. She has tried nothing for the symptoms.      Review of Systems  Constitutional: Positive for fatigue.  HENT: Positive for sore throat. Negative for ear pain, congestion, rhinorrhea, mouth sores, trouble swallowing, neck pain and voice change.   Respiratory: Negative for cough and shortness of breath.   Cardiovascular: Negative for chest pain.  Gastrointestinal: Positive for abdominal pain (crampy few days ago). Negative for vomiting and diarrhea.  Musculoskeletal: Negative for myalgias and arthralgias.  Skin: Negative for rash.  Neurological: Negative for headaches.  Hematological: Negative for adenopathy.  Psychiatric/Behavioral: Negative for behavioral problems, sleep disturbance, decreased concentration and agitation. The patient is not nervous/anxious and is not hyperactive.        Objective:   Physical Exam  Nursing note and vitals reviewed. Constitutional: She is oriented to person, place, and time. She appears well-developed and well-nourished. No distress (looks tired).  HENT:  Head: Normocephalic and atraumatic.  Right Ear: Tympanic membrane, external ear and ear canal normal.  Left Ear: External ear and ear canal normal. A middle ear effusion (clear fluid) is present.  Mouth/Throat:  Oropharynx is clear and moist. No oropharyngeal exudate.  Eyes: Conjunctivae are normal.  Neck: Normal range of motion. Neck supple. No thyromegaly present.  Cardiovascular: Normal rate, regular rhythm and normal heart sounds.   No murmur heard. Pulmonary/Chest: Effort normal and breath sounds normal. No respiratory distress. She has no wheezes.  Musculoskeletal: Normal range of motion.  Lymphadenopathy:    She has no cervical adenopathy.  Neurological: She is alert and oriented to person, place, and time.  Skin: Skin is warm and dry.  Psychiatric: She has a normal mood and affect. Her behavior is normal. Thought content normal.          Assessment & Plan:  1. Acute pharyngitis See pt instructions. - POCT rapid strep A - Culture, Group A Strep  2. Adult ADHD Follow up w/Dr. Milinda Cave in 1 month. - lisdexamfetamine (VYVANSE) 20 MG capsule; Take 1 capsule (20 mg total) by mouth every morning.  Dispense: 30 capsule; Refill: 0

## 2012-06-25 LAB — CULTURE, GROUP A STREP

## 2012-07-15 ENCOUNTER — Other Ambulatory Visit: Payer: Self-pay | Admitting: *Deleted

## 2012-07-15 ENCOUNTER — Ambulatory Visit: Payer: Managed Care, Other (non HMO) | Admitting: Family Medicine

## 2012-07-15 DIAGNOSIS — F909 Attention-deficit hyperactivity disorder, unspecified type: Secondary | ICD-10-CM

## 2012-07-15 MED ORDER — LISDEXAMFETAMINE DIMESYLATE 20 MG PO CAPS: 20.0000 mg | ORAL_CAPSULE | ORAL | Status: DC

## 2012-07-15 MED ORDER — FLUOXETINE HCL 20 MG PO TABS: 60.0000 mg | ORAL_TABLET | Freq: Every day | ORAL | Status: DC

## 2012-07-15 MED ORDER — CLONAZEPAM 1 MG PO TABS: ORAL_TABLET | ORAL | Status: DC

## 2012-07-15 NOTE — Progress Notes (Signed)
Pt in office for scheduled 74-month f/u visit w/drug screening? labs from 05.27.14 OV w/Layne Weaver. Pt's 05.27.14 AVS stated to f/u in 1-mth with PCP but no note for drug screening; pt arrived in confusion as to what was needed today to receive needed prescriptions.? No notation in last OV with Dr. Milinda Cave [03.24.14] for drug screening as well. After chart researched by PCP, pt was given scripts for 3-mths Vyvanse, Fluoxetine & Clonazepam and informed that she could reschedule F/U appointment 3-4 mth f/u [10.03.14]/SLS

## 2012-07-16 ENCOUNTER — Ambulatory Visit: Payer: Managed Care, Other (non HMO) | Admitting: Family Medicine

## 2012-08-03 IMAGING — CR DG CHEST 2V
2 series · 2 of 2 positions shown · non-contrast
Comparison: Chest x-ray of 06/14/2009

CLINICAL DATA: Cough, fatigue, chest tightness, smoking history

CHEST - 2 VIEW

[w chest pa]
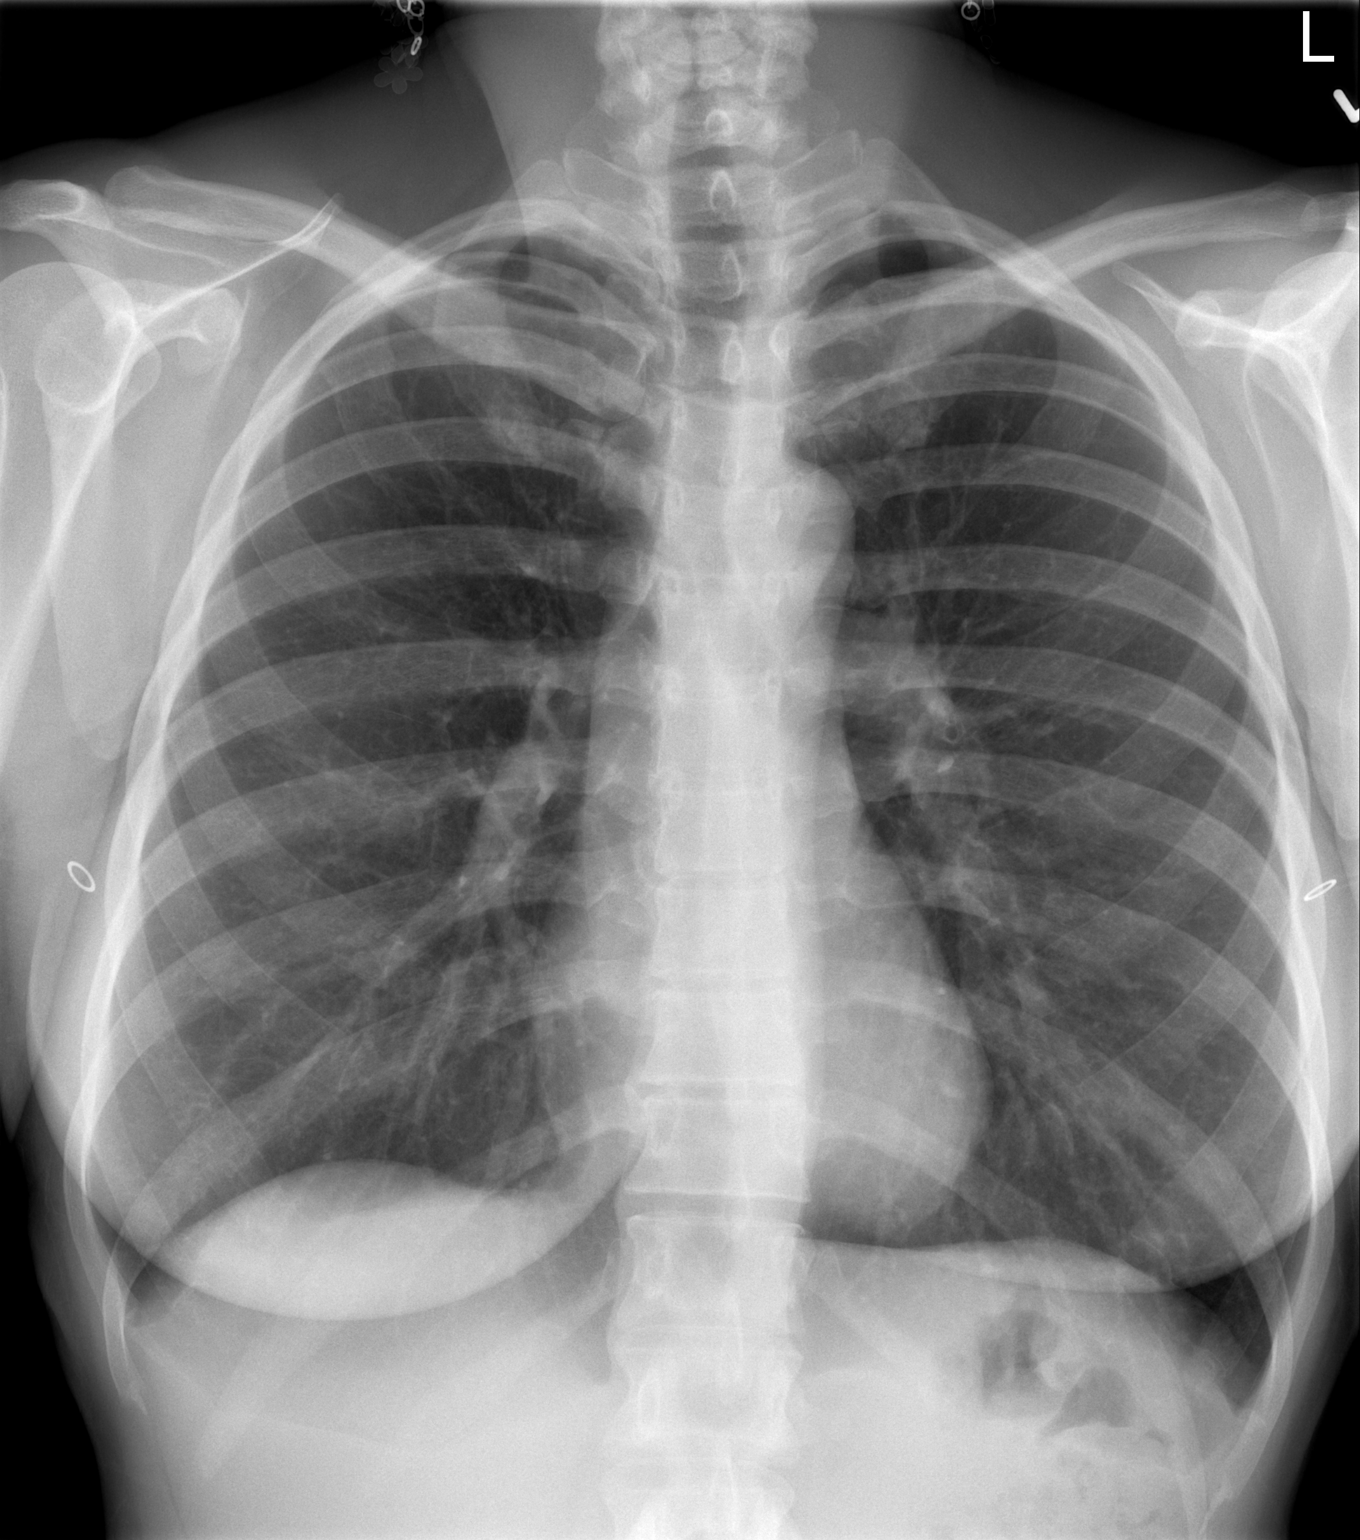

[w chest lat]
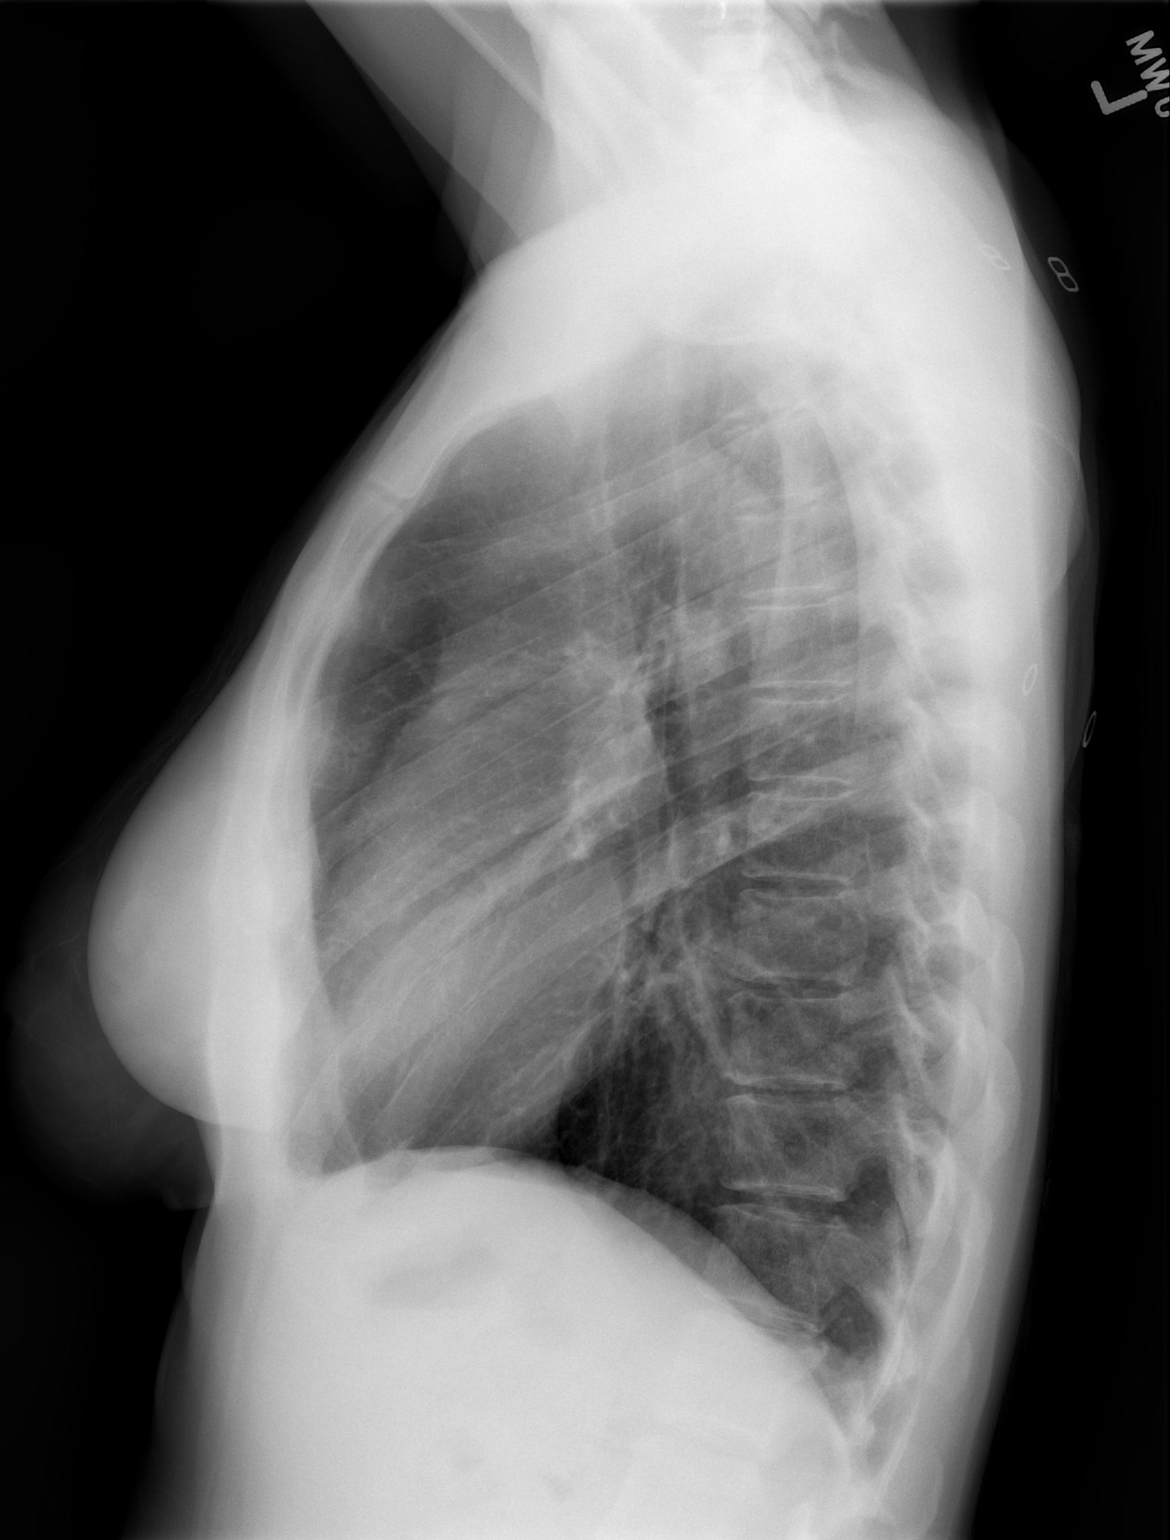

[2 of 2 positions shown; findings below may reference images not displayed]

FINDINGS: No active infiltrate or effusion is seen.  Mild
peribronchial thickening is present.  Mediastinal contours appear
normal.  The heart is within normal limits in size.  No bony
abnormality is seen.
IMPRESSION: No active lung disease.  Question bronchitis.

## 2012-08-18 ENCOUNTER — Other Ambulatory Visit: Payer: Self-pay | Admitting: Nurse Practitioner

## 2012-08-18 DIAGNOSIS — F909 Attention-deficit hyperactivity disorder, unspecified type: Secondary | ICD-10-CM

## 2012-08-18 MED ORDER — LISDEXAMFETAMINE DIMESYLATE 20 MG PO CAPS: 20.0000 mg | ORAL_CAPSULE | ORAL | Status: DC

## 2012-09-14 ENCOUNTER — Telehealth: Payer: Self-pay | Admitting: *Deleted

## 2012-09-14 MED ORDER — LISDEXAMFETAMINE DIMESYLATE 20 MG PO CAPS: 20.0000 mg | ORAL_CAPSULE | ORAL | Status: DC

## 2012-09-14 NOTE — Telephone Encounter (Signed)
Patient is aware that Dr. Milinda Cave will address this when he comes in tomorrow.-Nikki Attaway

## 2012-09-14 NOTE — Telephone Encounter (Signed)
Pls advise pt that I can't do anything about this until I get back in the office tomorrow.  Tell her she'll get a call back tomorrow after I have decided what to do.-thx

## 2012-09-14 NOTE — Telephone Encounter (Signed)
Patient left message on vm requesting return call concerning her Vyvanse rx. Returned call to patient. Patient stated that she is out of medication and she has 4 days left before she can get her next refill. Patient stated that she works crazy hours so sometimes she will take one pill early in the morning and then if she has to work in the evening she will take another pill. Patient stated that she has only done this a few times when she feels like the medications effect has worn off. Patient was advised not to miss doses, so she is wondering what she should do about not having any more pills. Patient was advised that Dr Milinda Cave will be out of office until 09/15/12 and that I would seek advise from [facility]. Patient stated that this would be fine. Patient requested a call back with advise. Please advise?

## 2012-09-14 NOTE — Telephone Encounter (Signed)
Rx printed for her to fill now. Pls notify pt that she must take this med only as prescribed.  No further early RF's --thx.

## 2012-09-14 NOTE — Telephone Encounter (Signed)
Patient called back stating she will be at the office when it opens tomorrow to discuss her medication and bring in the old prescription that is dated to fill on 09/18/12 so that she could get another prescription dated for 09/15/12.  I told her there was no guarantee the Doctor could see her or address this is issue as soon as he comes in.  She understands and is willing to wait.-Nikki Attaway

## 2012-09-14 NOTE — Telephone Encounter (Signed)
LMOVM for patient to return call.

## 2012-09-15 NOTE — Telephone Encounter (Signed)
Patient dropped off the rx for 09/18/12 and was given the rx dated 09/15/12.  Patient understands you would like for her to take her medication one a day and request a call from you today if possible.

## 2012-09-16 NOTE — Telephone Encounter (Signed)
LM on pt's cell phone VM to call back later today.

## 2012-09-17 NOTE — Telephone Encounter (Signed)
LM on pt's cell phone voice mail to call back at office to discuss vyvanse med questions she had.

## 2012-10-13 ENCOUNTER — Telehealth: Payer: Self-pay | Admitting: Family Medicine

## 2012-10-13 MED ORDER — LISDEXAMFETAMINE DIMESYLATE 20 MG PO CAPS: 20.0000 mg | ORAL_CAPSULE | ORAL | Status: DC

## 2012-10-13 MED ORDER — CLONAZEPAM 1 MG PO TABS: ORAL_TABLET | ORAL | Status: DC

## 2012-10-13 NOTE — Telephone Encounter (Signed)
Klonopin and vyvanse rx's printed.

## 2012-10-13 NOTE — Telephone Encounter (Signed)
Patient requesting refills on her klonopin and vyvanse.  Patient last seen 06/23/12.  Next OV is 10/30/12.  Patient last rx for klonopin was 07/15/12 x 2 refills.  Vyvanse last filled on 09/14/12.  Patient requesting to pick up on Wednesday 10/14/12 because she will be working in [place] after that so she won't be able to make it to the office any other day.  Please advise.

## 2012-10-14 NOTE — Telephone Encounter (Signed)
Patient aware that rx's are printed and at the front desk for her to pick up.

## 2012-10-28 ENCOUNTER — Telehealth: Payer: Self-pay | Admitting: Family Medicine

## 2012-10-28 NOTE — Telephone Encounter (Signed)
Patient went to the dermatologist. He prescribed pregnisone 20 MG 2 per day for 4 days & 1 per day 4 days for vermiculites. She is concerned about taking prednisone, she has heard about side effects. Can she take vyvanse and prednisone together? Please leave a message on patient's phone if she doesn't answer.

## 2012-10-28 NOTE — Telephone Encounter (Signed)
Per Dr. Milinda Cave, medications are okay to take together.  LMOM letting patient know.

## 2012-10-30 ENCOUNTER — Ambulatory Visit: Payer: Managed Care, Other (non HMO) | Admitting: Family Medicine

## 2012-11-05 ENCOUNTER — Ambulatory Visit (INDEPENDENT_AMBULATORY_CARE_PROVIDER_SITE_OTHER): Payer: Managed Care, Other (non HMO) | Admitting: Family Medicine

## 2012-11-05 ENCOUNTER — Encounter: Payer: Self-pay | Admitting: Family Medicine

## 2012-11-05 VITALS — BP 134/90 | HR 73 | Temp 98.8°F | Resp 18 | Ht 62.0 in | Wt 105.0 lb

## 2012-11-05 DIAGNOSIS — F909 Attention-deficit hyperactivity disorder, unspecified type: Secondary | ICD-10-CM

## 2012-11-05 DIAGNOSIS — F329 Major depressive disorder, single episode, unspecified: Secondary | ICD-10-CM

## 2012-11-05 DIAGNOSIS — F3289 Other specified depressive episodes: Secondary | ICD-10-CM

## 2012-11-05 DIAGNOSIS — R5383 Other fatigue: Secondary | ICD-10-CM

## 2012-11-05 DIAGNOSIS — R5381 Other malaise: Secondary | ICD-10-CM

## 2012-11-05 DIAGNOSIS — F411 Generalized anxiety disorder: Secondary | ICD-10-CM

## 2012-11-05 LAB — CBC WITH DIFFERENTIAL/PLATELET
Basophils Relative: 0.3 % (ref 0.0–3.0)
Eosinophils Relative: 1 % (ref 0.0–5.0)
HCT: 38 % (ref 36.0–46.0)
Lymphs Abs: 2.7 10*3/uL (ref 0.7–4.0)
MCV: 90 fl (ref 78.0–100.0)
Monocytes Absolute: 0.6 10*3/uL (ref 0.1–1.0)
Neutrophils Relative %: 66.5 % (ref 43.0–77.0)
RBC: 4.22 Mil/uL (ref 3.87–5.11)
WBC: 10.3 10*3/uL (ref 4.5–10.5)

## 2012-11-05 LAB — COMPREHENSIVE METABOLIC PANEL
CO2: 24 mEq/L (ref 19–32)
Calcium: 9.5 mg/dL (ref 8.4–10.5)
GFR: 106.12 mL/min (ref >60.00)
Glucose, Bld: 77 mg/dL (ref 70–99)
Sodium: 136 mEq/L (ref 135–145)
Total Bilirubin: 0.4 mg/dL (ref 0.3–1.2)
Total Protein: 6.9 g/dL (ref 6.0–8.3)

## 2012-11-05 MED ORDER — LISDEXAMFETAMINE DIMESYLATE 40 MG PO CAPS: 40.0000 mg | ORAL_CAPSULE | ORAL | Status: DC

## 2012-11-05 NOTE — Progress Notes (Signed)
OFFICE NOTE  11/05/2012  CC:  Chief Complaint  Patient presents with  . ADHD  . labs done     HPI: Patient is a 47 y.o. Caucasian female who is here for 6+mo f/u Adult ADHD and depression. She has found that taking two of her 20mg  vyvanse qAM has been VERY helpful compared to 20mg  qAM dosing. She feels like her mood is stable. She does have relatively frequent fatigue and need to take a nap in late afternoons some days. Asks about checking "blood work" for this today. She recently had annual GYN check up--pelvic done but no pap needed/due. LMP was 4 d/a, menses occurring about every 4-5 wks still, no heavy bleeding. No snoring or witnessed apneic periods in sleep.  She smokes <1/2 pack per day cigs still.  Wants to quit but currently has no plans to quit, although she has been contemplating e-cigs lately. She is fearful of trying chantix.  Pertinent PMH:  Past Medical History  Diagnosis Date  . History of ETT 06/2009    Normal/No ischemia  . Atypical chest pain 06/2009  . Alcoholism     [facility] ICU admission 2007 for acute Alc intox,; also detox at [facility] 11/2005  . Tobacco dependence   . Depression   . Low back pain     Pt reports L/S x-ray that showed deg arth in the past  . History of mammogram     Normal, most recent 09/2007 (through her OB/GYN)  . History of abnormal Pap smear 11/2006    ASC-US, High risk HPV negative.  Follow up paps normal.   Past Surgical History  Procedure Laterality Date  . Exercise treadmill test  06/2009    No ischemia  . Transthoracic echocardiogram  11/2009    Normal except grade I diastolic dysfunction  . Bunionectomy  1997    Left foot   History   Social History Narrative   Married, 2 sons (ages 31y and 9y---Aiden and Merrifield).   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   Originally from [place].   Tobacco: 20+ yrs of 1/2-1 pack/day.   ETOH abuse in past: sober x 3 yrs as of 05/2010.   No illegal drug abuse.   Past  family and social history reviewed and there are no changes since the patient's last office visit with me.  MEDS:  Outpatient Prescriptions Prior to Visit  Medication Sig Dispense Refill  . clonazePAM (KLONOPIN) 1 MG tablet Take 1-2 tabs po q12h prn anxiety/mood lability during menses  30 tablet  5  . fexofenadine (ALLEGRA) 180 MG tablet Take 180 mg by mouth daily. itching      . FLUoxetine (PROZAC) 20 MG tablet Take 3 tablets (60 mg total) by mouth daily.  90 tablet  3  . lisdexamfetamine (VYVANSE) 20 MG capsule Take 1 capsule (20 mg total) by mouth every morning.  30 capsule  0  . lisdexamfetamine (VYVANSE) 20 MG capsule Take 1 capsule (20 mg total) by mouth every morning.  30 capsule  0  . fluocinonide cream (LIDEX) 0.05 % Apply topically as needed.       No facility-administered medications prior to visit.    PE: Blood pressure 134/90, pulse 73, temperature 98.8 F (37.1 C), temperature source Temporal, resp. rate 18, height 5\' 2"  (1.575 m), weight 105 lb (47.628 kg), last menstrual period 10/30/2012, SpO2 100.00%. Wt Readings from Last 2 Encounters:  11/05/12 105 lb (47.628 kg)  04/20/12 109 lb (49.442 kg)  Gen: alert, oriented x 4, affect pleasant.  Lucid thinking and conversation noted. HEENT: PERRLA, EOMI.   Neck: no LAD, mass, or thyromegaly. CV: RRR, no m/r/g LUNGS: CTA bilat, nonlabored. NEURO: no tremor or tics noted on observation.  Coordination intact. CN 2-12 grossly intact bilaterally, strength 5/5 in all extremeties.  No ataxia.   IMPRESSION AND PLAN:  Adult ADHD Good response to 40mg  qAM dosing. Gave rx for this month as well as rx for Nov and for Dec--each with the approp fill on/after dating. At next f/u appt we'll have to have them sign a controlled substances contract as per the new standards set by [facility] medical board.    DEPRESSION The current medical regimen is effective;  continue present plan and medications.   Anxiety state, unspecified The  current medical regimen is effective;  continue present plan and medications. Continue clonazepam.  No new rx needed today.  Fatigue I think this is probably appropriate fatigue given her long work hours and excessive responsibility as a mother/wife. I think quitting smoking and starting regular exercise would help a lot.  She is considering smoking cessation and we briefly discussed options today but she wants to try either "cold [place]" or possibly e-cigs in the near future. No snoring or apnea witnessed during sleep per pt report, so OSA low risk. Check CBC w/diff, TSH, and CMET today.   An After Visit Summary was printed and given to the patient.  FOLLOW UP: 4 mo

## 2012-11-05 NOTE — Assessment & Plan Note (Signed)
Good response to 40mg  qAM dosing. Gave rx for this month as well as rx for Nov and for Dec--each with the approp fill on/after dating. At next f/u appt we'll have to have them sign a controlled substances contract as per the new standards set by [facility] medical board.

## 2012-11-05 NOTE — Assessment & Plan Note (Signed)
The current medical regimen is effective;  continue present plan and medications. Continue clonazepam.  No new rx needed today.

## 2012-11-05 NOTE — Assessment & Plan Note (Signed)
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 I think quitting smoking and starting regular exercise would help a lot.  She is considering smoking cessation and we briefly discussed options today but she wants to try either "cold [place]" or possibly e-cigs in the near future. No snoring or apnea witnessed during sleep per pt report, so OSA low risk. Check CBC w/diff, TSH, and CMET today.

## 2012-11-05 NOTE — Assessment & Plan Note (Signed)
The current medical regimen is effective;  continue present plan and medications.  

## 2013-02-02 ENCOUNTER — Telehealth: Payer: Self-pay | Admitting: Family Medicine

## 2013-02-02 MED ORDER — LISDEXAMFETAMINE DIMESYLATE 40 MG PO CAPS: 40.0000 mg | ORAL_CAPSULE | ORAL | Status: DC

## 2013-02-02 NOTE — Telephone Encounter (Signed)
Patient requesting refill on vyvanse.  Patient last seen on 11/05/12.  Last refill stated fill on or after 01/03/13.   Please advise refill.

## 2013-02-02 NOTE — Telephone Encounter (Signed)
LMOM for patient to pick up Rx's at front desk.

## 2013-02-02 NOTE — Telephone Encounter (Signed)
She has f/u scheduled for feb. I printed rx for this month and next month.-thx

## 2013-03-08 ENCOUNTER — Ambulatory Visit: Payer: Managed Care, Other (non HMO) | Admitting: Family Medicine

## 2013-03-19 ENCOUNTER — Telehealth: Payer: Self-pay | Admitting: Family Medicine

## 2013-03-19 MED ORDER — CLONAZEPAM 1 MG PO TABS: ORAL_TABLET | ORAL | Status: DC

## 2013-03-19 NOTE — Telephone Encounter (Signed)
Due to missed f/u appt and pattern of early RF of this med lately, I'll authorize 1 mo supply with no RF's and pt is to schedule f/u anxiety visit.

## 2013-03-19 NOTE — Telephone Encounter (Signed)
Patient wanting her klonopin refilled.  Patient last seen on 11/05/12, NO SHOW on 03/18/2013.  Refill history: 10/14/12 11/13/12 12/11/12 01/10/13 01/30/13 02/23/13     Patient requesting refill early.  Please advise.

## 2013-03-19 NOTE — Telephone Encounter (Signed)
Rx faxed

## 2013-03-19 NOTE — Telephone Encounter (Signed)
Reviewed sig on the clonaz rx and it actually does say 1-2 tabs q12 prn anxiety, so her RF's have not technically been early. Pt still needs to f/u in the next month or so but I'll go ahead and RF clonaz as prev rx'd, with 5 additional RFs.

## 2013-04-02 ENCOUNTER — Ambulatory Visit (INDEPENDENT_AMBULATORY_CARE_PROVIDER_SITE_OTHER): Payer: Managed Care, Other (non HMO) | Admitting: Family Medicine

## 2013-04-02 ENCOUNTER — Other Ambulatory Visit: Payer: Self-pay | Admitting: Family Medicine

## 2013-04-02 ENCOUNTER — Encounter: Payer: Self-pay | Admitting: Family Medicine

## 2013-04-02 VITALS — BP 109/80 | HR 78 | Temp 98.7°F | Resp 16 | Ht 62.0 in | Wt 108.0 lb

## 2013-04-02 DIAGNOSIS — F411 Generalized anxiety disorder: Secondary | ICD-10-CM

## 2013-04-02 DIAGNOSIS — M7918 Myalgia, other site: Secondary | ICD-10-CM

## 2013-04-02 DIAGNOSIS — F3289 Other specified depressive episodes: Secondary | ICD-10-CM

## 2013-04-02 DIAGNOSIS — F909 Attention-deficit hyperactivity disorder, unspecified type: Secondary | ICD-10-CM

## 2013-04-02 DIAGNOSIS — F329 Major depressive disorder, single episode, unspecified: Secondary | ICD-10-CM

## 2013-04-02 DIAGNOSIS — M25519 Pain in unspecified shoulder: Secondary | ICD-10-CM

## 2013-04-02 MED ORDER — LISDEXAMFETAMINE DIMESYLATE 40 MG PO CAPS: 40.0000 mg | ORAL_CAPSULE | ORAL | Status: DC

## 2013-04-02 NOTE — Progress Notes (Signed)
Pre visit review using our clinic review tool, if applicable. No additional management support is needed unless otherwise documented below in the visit note. 

## 2013-04-02 NOTE — Telephone Encounter (Signed)
[facility].  Patient over due for 4 month follow up and needs to be scheduled.  Request denied.  LMOM for pt to return call and make appointment.

## 2013-04-02 NOTE — Progress Notes (Signed)
OFFICE NOTE  04/02/2013  CC:  Chief Complaint  Patient presents with  . Follow-up  . Medication Refill     HPI: Patient is a 48 y.o. Caucasian female who is here for adult ADD and anxiety/depression follow up. Doing well.  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 Mood stable. Clonazepam usually takes once daily, some days none.  Some off/on pain in right shoulder region over last few months, no injury.  Sleeping on it outstretched makes it hurt.  No weakness, no swelling, no inhibition of sleep.  The pain is mild.  Pertinent PMH:  Past medical, surgical, social, and family history reviewed and no changes are noted since last office visit.  MEDS:  Outpatient Prescriptions Prior to Visit  Medication Sig Dispense Refill  . clonazePAM (KLONOPIN) 1 MG tablet Take 1-2 tabs po q12h prn anxiety/mood lability during menses  30 tablet  5  . fexofenadine (ALLEGRA) 180 MG tablet Take 180 mg by mouth daily. itching      . FLUoxetine (PROZAC) 20 MG tablet Take 3 tablets (60 mg total) by mouth daily.  90 tablet  3  . lisdexamfetamine (VYVANSE) 40 MG capsule Take 1 capsule (40 mg total) by mouth every morning.  30 capsule  0  . fluocinonide cream (LIDEX) 0.05 % Apply topically as needed.      . cephALEXin (KEFLEX) 500 MG capsule Take 500 mg by mouth 2 (two) times daily.       No facility-administered medications prior to visit.  *not taking cephalexin listed above.  PE: Blood pressure 109/80, pulse 78, temperature 98.7 F (37.1 C), temperature source Temporal, resp. rate 16, height 5\' 2"  (1.575 m), weight 108 lb (48.988 kg), SpO2 97.00%. Wt Readings from Last 2 Encounters:  04/02/13 108 lb (48.988 kg)  11/05/12 105 lb (47.628 kg)   Gen: alert, oriented x 4, affect pleasant.  Lucid thinking and conversation noted. HEENT: PERRLA, EOMI.   Neck: no LAD, mass, or thyromegaly. CV: RRR, no m/r/g LUNGS: CTA bilat,  nonlabored. NEURO: no tremor or tics noted on observation.  Coordination intact. CN 2-12 grossly intact bilaterally, strength 5/5 in all extremeties.  No ataxia. Right shoulder with mild TTP over posterior-most aspect of deltoid.  Otherwise shoulder nontender.  ROM of shoulder intact.  Reaching across body with right arm elicits pain in the posterior deltoid soft tissue region. Impingement signs neg.  Rotator cuff signs neg.  Strength intact, sensation intact.   IMPRESSION AND PLAN:  1) Adult ADD: improved/stable on current vyvanse 40mg  qd dosing. No adverse side effects. I printed rx's for vyvanse 40mg  today for this month, April 2015, and May 2015.  Appropriate fill on/after date was noted on each rx.  Controlled substance contract reviewed with patient today.  Patient signed this and it will be placed in the chart.   No UDS today.  2) Anxiety/Dep: stable.  Continue antidepressant daily and once daily clonazepam prn.  3) Deltoid muscle pain: reassured.  Discussed NSAIDs, relative rest.  An After Visit Summary was printed and given to the patient.  FOLLOW UP: 82mo

## 2013-04-20 ENCOUNTER — Telehealth: Payer: Self-pay | Admitting: Family Medicine

## 2013-04-20 NOTE — Telephone Encounter (Signed)
Patient left message stating that she is losing her insurance April 1st.  She knows her rx isn't due for refill until the 6th.  Is there anyway we can fill it early or change the medication so she can get it with her insurance.   Please advise.

## 2013-04-20 NOTE — Telephone Encounter (Signed)
Patient is going to check with her pharmacy again to check cost on medications.  She will get back to us with what she decides to do.

## 2013-04-20 NOTE — Telephone Encounter (Signed)
No RF of Vyvanse this early. Generic, short-acting adderall would be the most affordable without insurance, so if she wants to finish her current rx of vyvanse I'll do a rx for adderall to fill 05/02/13. If we do this, then she either has to bring the other vyvanse rx's she has back and let us shred them OR if they are being kept for her at her pharmacy make sure we tell the pharmacy to cancel them. Let me know.--thx

## 2013-04-26 MED ORDER — FLUOXETINE HCL 20 MG PO TABS: 60.0000 mg | ORAL_TABLET | Freq: Every day | ORAL | Status: DC

## 2013-04-27 MED ORDER — AMPHETAMINE-DEXTROAMPHETAMINE 20 MG PO TABS: 20.0000 mg | ORAL_TABLET | Freq: Two times a day (BID) | ORAL | Status: DC

## 2013-04-27 MED ORDER — AMPHETAMINE-DEXTROAMPHETAMINE 20 MG PO TABS
20.0000 mg | ORAL_TABLET | Freq: Two times a day (BID) | ORAL | Status: DC
Start: 2013-04-27 — End: 2013-07-02

## 2013-04-27 NOTE — Telephone Encounter (Signed)
Generic short acting adderall rx's printed for this month and one for May.

## 2013-04-27 NOTE — Telephone Encounter (Signed)
Please advise refill of generic, short-acting adderall.

## 2013-04-27 NOTE — Telephone Encounter (Signed)
Patient has spoken with pharmacy. The vyvanse is going to cost $250 without insurance, can adderral be called in today? Please call patient at [phone]. Patient has left orginial Rxs here. I have put them on your desk.

## 2013-04-28 NOTE — Telephone Encounter (Signed)
lmom for pt to pick up Rx at front desk.

## 2013-07-02 ENCOUNTER — Telehealth: Payer: Self-pay | Admitting: Family Medicine

## 2013-07-02 MED ORDER — AMPHETAMINE-DEXTROAMPHETAMINE 20 MG PO TABS: 20.0000 mg | ORAL_TABLET | Freq: Two times a day (BID) | ORAL | Status: DC

## 2013-07-02 NOTE — Telephone Encounter (Signed)
Patient aware rx is ready for pickup. 

## 2013-07-02 NOTE — Telephone Encounter (Signed)
Pt lmom stating that she is due for rf on adderral. Patient last OV was 04/02/13 and her last Rx stated to fill on or after 05/27/13.  Pt states that she will have insurance in July if she could just get one more month of Rx and then she will come in for an appointment.   Please advise.

## 2013-07-02 NOTE — Telephone Encounter (Signed)
Adderall rx printed. 

## 2013-08-03 ENCOUNTER — Telehealth: Payer: Self-pay | Admitting: Family Medicine

## 2013-08-03 ENCOUNTER — Other Ambulatory Visit: Payer: Self-pay | Admitting: *Deleted

## 2013-08-03 MED ORDER — AMPHETAMINE-DEXTROAMPHETAMINE 20 MG PO TABS: 20.0000 mg | ORAL_TABLET | Freq: Two times a day (BID) | ORAL | Status: DC

## 2013-08-03 NOTE — Telephone Encounter (Addendum)
Patient requesting addition one month refill of adderall.  Patient states that she "did not recall needing an appointment in July" as stated in the 07/02/13 telephone note.  I tried to schedule an appointment but patient declined at this time.  Please advise.

## 2013-08-03 NOTE — Telephone Encounter (Signed)
Rx printed.  Must have routine f/u appt within the next 30 d or no more RFs of this med.

## 2013-08-04 MED ORDER — CLONAZEPAM 1 MG PO TABS: ORAL_TABLET | ORAL | Status: DC

## 2013-08-04 NOTE — Telephone Encounter (Signed)
Patient also requesting clonazepam rf.  I forgot to add this in the previous note.  Sorry.  Please advise.

## 2013-08-04 NOTE — Telephone Encounter (Signed)
Rx faxed.  Patient aware adderall Rx is at front for pick up.

## 2013-08-04 NOTE — Telephone Encounter (Signed)
rx for [facility] printed.

## 2013-08-23 ENCOUNTER — Ambulatory Visit: Payer: Managed Care, Other (non HMO) | Admitting: Family Medicine

## 2013-08-27 ENCOUNTER — Encounter: Payer: Self-pay | Admitting: Family Medicine

## 2013-08-27 ENCOUNTER — Ambulatory Visit (INDEPENDENT_AMBULATORY_CARE_PROVIDER_SITE_OTHER): Payer: Private Health Insurance - Indemnity | Admitting: Family Medicine

## 2013-08-27 VITALS — BP 117/78 | HR 95 | Temp 98.4°F | Resp 18 | Ht 62.0 in | Wt 106.0 lb

## 2013-08-27 DIAGNOSIS — F3289 Other specified depressive episodes: Secondary | ICD-10-CM

## 2013-08-27 DIAGNOSIS — F411 Generalized anxiety disorder: Secondary | ICD-10-CM

## 2013-08-27 DIAGNOSIS — L739 Follicular disorder, unspecified: Secondary | ICD-10-CM | POA: Insufficient documentation

## 2013-08-27 DIAGNOSIS — L738 Other specified follicular disorders: Secondary | ICD-10-CM

## 2013-08-27 DIAGNOSIS — F909 Attention-deficit hyperactivity disorder, unspecified type: Secondary | ICD-10-CM

## 2013-08-27 DIAGNOSIS — F329 Major depressive disorder, single episode, unspecified: Secondary | ICD-10-CM

## 2013-08-27 MED ORDER — AMPHETAMINE-DEXTROAMPHETAMINE 20 MG PO TABS: 20.0000 mg | ORAL_TABLET | Freq: Two times a day (BID) | ORAL | Status: DC

## 2013-08-27 MED ORDER — MUPIROCIN 2 % EX OINT: TOPICAL_OINTMENT | CUTANEOUS | Status: DC

## 2013-08-27 MED ORDER — CLONAZEPAM 1 MG PO TABS: ORAL_TABLET | ORAL | Status: DC

## 2013-08-27 MED ORDER — FLUOXETINE HCL 20 MG PO TABS: 60.0000 mg | ORAL_TABLET | Freq: Every day | ORAL | Status: DC

## 2013-08-27 NOTE — Progress Notes (Signed)
OFFICE NOTE  08/27/2013  CC:  Chief Complaint  Patient presents with  . Follow-up     HPI: Patient is a 48 y.o. Caucasian female who is here for 4 mo f/u adult ADD, anxiety and depression. Lost job 03/2013 at [employer]. Now working at [employer], better hours and less stress.  She is happy with these changes. She has remained on her chronic meds.  She remains off of alcohol. She uses 1 clonaz most days, but a fair amount of days she takes a second clonaz pill.   Pertinent PMH:  Past medical, surgical, social, and family history reviewed and no changes are noted since last office visit.  MEDS:  Outpatient Prescriptions Prior to Visit  Medication Sig Dispense Refill  . amphetamine-dextroamphetamine (ADDERALL) 20 MG tablet Take 1 tablet (20 mg total) by mouth 2 (two) times daily.  60 tablet  0  . clonazePAM (KLONOPIN) 1 MG tablet Take 1-2 tabs po q12h prn anxiety  30 tablet  5  . fexofenadine (ALLEGRA) 180 MG tablet Take 180 mg by mouth daily. itching      . FLUoxetine (PROZAC) 20 MG tablet Take 3 tablets (60 mg total) by mouth daily.  90 tablet  3  . fluocinonide cream (LIDEX) 0.05 % Apply topically as needed.       No facility-administered medications prior to visit.    PE: Blood pressure 117/78, pulse 95, temperature 98.4 F (36.9 C), temperature source Temporal, resp. rate 18, height 5\' 2"  (1.575 m), weight 106 lb (48.081 kg), SpO2 100.00%. Wt Readings from Last 2 Encounters:  08/27/13 106 lb (48.081 kg)  04/02/13 108 lb (48.988 kg)    Gen: alert, oriented x 4, affect pleasant.  Lucid thinking and conversation noted. HEENT: PERRLA, EOMI.   Neck: no LAD, mass, or thyromegaly. CV: RRR, no m/r/g LUNGS: CTA bilat, nonlabored. NEURO: no tremor or tics noted on observation.  Coordination intact. CN 2-12 grossly intact bilaterally, strength 5/5 in all extremeties.  No ataxia.   IMPRESSION AND PLAN:  1) Adult ADD: The current medical regimen is effective;  continue present  plan and medications. I printed rx's for adderall 20mg  bid, #60 today for this month, August, and September 2015.  Appropriate fill on/after date was noted on each rx.  2) Anxiety and depression: The current medical regimen is effective;  continue present plan and medications. Clonaz 1mg  bid prn, #45 per month.  3) Staph folliculitis, recurrent.  Bactroban ointment to the lesions near her hairline today.  An After Visit Summary was printed and given to the patient.  FOLLOW UP: 38mo

## 2013-08-27 NOTE — Progress Notes (Signed)
Pre visit review using our clinic review tool, if applicable. No additional management support is needed unless otherwise documented below in the visit note. 

## 2013-12-03 ENCOUNTER — Encounter: Payer: Self-pay | Admitting: Family Medicine

## 2013-12-03 ENCOUNTER — Ambulatory Visit (INDEPENDENT_AMBULATORY_CARE_PROVIDER_SITE_OTHER): Payer: BC Managed Care – PPO | Admitting: Family Medicine

## 2013-12-03 VITALS — BP 122/86 | HR 92 | Temp 98.6°F | Resp 16 | Ht 62.0 in | Wt 102.0 lb

## 2013-12-03 DIAGNOSIS — F411 Generalized anxiety disorder: Secondary | ICD-10-CM

## 2013-12-03 DIAGNOSIS — F32A Depression, unspecified: Secondary | ICD-10-CM

## 2013-12-03 DIAGNOSIS — F329 Major depressive disorder, single episode, unspecified: Secondary | ICD-10-CM

## 2013-12-03 DIAGNOSIS — F909 Attention-deficit hyperactivity disorder, unspecified type: Secondary | ICD-10-CM

## 2013-12-03 DIAGNOSIS — F900 Attention-deficit hyperactivity disorder, predominantly inattentive type: Secondary | ICD-10-CM

## 2013-12-03 MED ORDER — AMPHETAMINE-DEXTROAMPHETAMINE 20 MG PO TABS: 20.0000 mg | ORAL_TABLET | Freq: Two times a day (BID) | ORAL | Status: DC

## 2013-12-03 NOTE — Assessment & Plan Note (Signed)
The current medical regimen is effective;  continue present plan and medications. No new rx's needed today.

## 2013-12-03 NOTE — Assessment & Plan Note (Signed)
The current medical regimen is effective;  continue present plan and medications. I printed adderall rx's for this month, December 2015, and January 2016.Marland Kitchen.  Appropriate fill on/after date was noted on each rx. UDS done today.

## 2013-12-03 NOTE — Assessment & Plan Note (Signed)
The current medical regimen is effective;  continue present plan and medications. No new rx's needed today. 

## 2013-12-03 NOTE — Progress Notes (Signed)
OFFICE VISIT  12/03/2013   CC:  Chief Complaint  Patient presents with  . Follow-up   HPI:    Patient is a 48 y.o. Caucasian female who presents for 4 mo f/u adult ADD, anxiety, and depression.   All focus/concentration, mood, and anxiety are stable. Compliant with meds.  No side effects.  Past Medical History  Diagnosis Date  . History of ETT 06/2009    Normal/No ischemia  . Atypical chest pain 06/2009  . Alcoholism     [facility] admission 2007 for acute Alc intox,; [facility]  . Tobacco dependence   . Depression   . Low back pain     Pt reports L/S x-ray that showed deg arth in the past  . History of mammogram     Normal, most recent 09/2007 (through her OB/GYN)  . History of abnormal Pap smear 11/2006    ASC-US, High risk HPV negative.  Follow up paps normal.  . Adult ADHD     Past Surgical History  Procedure Laterality Date  . Exercise treadmill test  06/2009    No ischemia  . Transthoracic echocardiogram  11/2009    Normal except grade I diastolic dysfunction  . Bunionectomy  1997    Left foot    Outpatient Prescriptions Prior to Visit  Medication Sig Dispense Refill  . clonazePAM (KLONOPIN) 1 MG tablet Take 1-2 tabs po q12h prn anxiety 45 tablet 5  . fexofenadine (ALLEGRA) 180 MG tablet Take 180 mg by mouth daily. itching    . FLUoxetine (PROZAC) 20 MG tablet Take 3 tablets (60 mg total) by mouth daily. 90 tablet 11  . amphetamine-dextroamphetamine (ADDERALL) 20 MG tablet Take 1 tablet (20 mg total) by mouth 2 (two) times daily. 60 tablet 0  . fluocinonide cream (LIDEX) 0.05 % Apply topically as needed.    . mupirocin ointment (BACTROBAN) 2 % Apply to affected areas bid prn 30 g 3   No facility-administered medications prior to visit.    No Known Allergies  ROS As per HPI  PE: Blood pressure 122/86, pulse 92, temperature 98.6 F (37 C), temperature source Temporal, resp. rate 16, height 5\' 2"  (1.575 m), weight 102 lb (46.267 kg), SpO2 99  %. Wt Readings from Last 2 Encounters:  12/03/13 102 lb (46.267 kg)  08/27/13 106 lb (48.081 kg)    Gen: alert, oriented x 4, affect pleasant.  Lucid thinking and conversation noted. HEENT: PERRLA, EOMI.   Neck: no LAD, mass, or thyromegaly. CV: RRR, no m/r/g LUNGS: CTA bilat, nonlabored. NEURO: no tremor or tics noted on observation.  Coordination intact.  Biceps DTRs 2+ bilat.  Patellar DTRs 3-4 + bilat, no ankle clonus. CN 2-12 grossly intact bilaterally, strength 5/5 in all extremeties.  No ataxia.   LABS:  none  IMPRESSION AND PLAN:  Adult ADHD The current medical regimen is effective;  continue present plan and medications. I printed adderall rx's for this month, December 2015, and January 2016.Marland Kitchen.  Appropriate fill on/after date was noted on each rx. UDS done today.  Anxiety state The current medical regimen is effective;  continue present plan and medications. No new rx's needed today.  Depression The current medical regimen is effective;  continue present plan and medications. No new rx's needed today.   Pt aware of need for routine GYN f/u and says that now that she has health insurance she will be arranging this pretty soon.  An After Visit Summary was printed and given  to the patient.  FOLLOW UP: Return in about 4 months (around 04/03/2014) for routine chronic illness f/u.

## 2013-12-03 NOTE — Progress Notes (Signed)
Pre visit review using our clinic review tool, if applicable. No additional management support is needed unless otherwise documented below in the visit note. 

## 2013-12-06 ENCOUNTER — Telehealth: Payer: Self-pay | Admitting: Family Medicine

## 2013-12-06 NOTE — Telephone Encounter (Signed)
emmi emailed °

## 2013-12-10 ENCOUNTER — Ambulatory Visit (INDEPENDENT_AMBULATORY_CARE_PROVIDER_SITE_OTHER): Payer: BC Managed Care – PPO | Admitting: Nurse Practitioner

## 2013-12-10 ENCOUNTER — Encounter: Payer: Self-pay | Admitting: Nurse Practitioner

## 2013-12-10 VITALS — BP 93/64 | HR 87 | Temp 98.7°F | Ht 62.0 in | Wt 104.0 lb

## 2013-12-10 DIAGNOSIS — J069 Acute upper respiratory infection, unspecified: Secondary | ICD-10-CM

## 2013-12-10 MED ORDER — BENZONATATE 100 MG PO CAPS: ORAL_CAPSULE | ORAL | Status: DC

## 2013-12-10 NOTE — Progress Notes (Signed)
Pre visit review using our clinic review tool, if applicable. No additional management support is needed unless otherwise documented below in the visit note. 

## 2013-12-10 NOTE — Patient Instructions (Signed)
You have a  virus causing your symptoms. The average duration of  symptoms is 14 days. However, if you develop bronchitis, you may cough for 4 weeks. Start daily sinus rinses (neilmed Sinus Rinse). Take benzonatate capsules for cough. Sip fluids every hour. Rest. If you are not feeling better in 2 weeks or develop fever or chest pain, call us for re-evaluation. Feel better!  Upper Respiratory Infection, Adult An upper respiratory infection (URI) is also sometimes known as the common cold. The upper respiratory tract includes the nose, sinuses, throat, trachea, and bronchi. Bronchi are the airways leading to the lungs. Most people improve within 1 week, but symptoms can last up to 2 weeks. A residual cough may last even longer.  CAUSES Many different viruses can infect the tissues lining the upper respiratory tract. The tissues become irritated and inflamed and often become very moist. Mucus production is also common. A cold is contagious. You can easily spread the virus to others by oral contact. This includes kissing, sharing a glass, coughing, or sneezing. Touching your mouth or nose and then touching a surface, which is then touched by another person, can also spread the virus. SYMPTOMS  Symptoms typically develop 1 to 3 days after you come in contact with a cold virus. Symptoms vary from person to person. They may include:  Runny nose.  Sneezing.  Nasal congestion.  Sinus irritation.  Sore throat.  Loss of voice (laryngitis).  Cough.  Fatigue.  Muscle aches.  Loss of appetite.  Headache.  Low-grade fever. DIAGNOSIS  You might diagnose your own cold based on familiar symptoms, since most people get a cold 2 to 3 times a year. Your caregiver can confirm this based on your exam. Most importantly, your caregiver can check that your symptoms are not due to another disease such as strep throat, sinusitis, pneumonia, asthma, or epiglottitis. Blood tests, throat tests, and X-rays are not  necessary to diagnose a common cold, but they may sometimes be helpful in excluding other more serious diseases. Your caregiver will decide if any further tests are required. RISKS AND COMPLICATIONS  You may be at risk for a more severe case of the common cold if you smoke cigarettes, have chronic heart disease (such as heart failure) or lung disease (such as asthma), or if you have a weakened immune system. The very young and very old are also at risk for more serious infections. Bacterial sinusitis, middle ear infections, and bacterial pneumonia can complicate the common cold. The common cold can worsen asthma and chronic obstructive pulmonary disease (COPD). Sometimes, these complications can require emergency medical care and may be life-threatening. PREVENTION  The best way to protect against getting a cold is to practice good hygiene. Avoid oral or hand contact with people with cold symptoms. Wash your hands often if contact occurs. There is no clear evidence that vitamin C, vitamin E, echinacea, or exercise reduces the chance of developing a cold. However, it is always recommended to get plenty of rest and practice good nutrition. TREATMENT  Treatment is directed at relieving symptoms. There is no cure. Antibiotics are not effective, because the infection is caused by a virus, not by bacteria. Treatment may include:  Increased fluid intake. Sports drinks offer valuable electrolytes, sugars, and fluids.  Breathing heated mist or steam (vaporizer or shower).  Eating chicken soup or other clear broths, and maintaining good nutrition.  Getting plenty of rest.  Using gargles or lozenges for comfort.  Controlling fevers with ibuprofen or  acetaminophen as directed by your caregiver.  Increasing usage of your inhaler if you have asthma. Zinc gel and zinc lozenges, taken in the first 24 hours of the common cold, can shorten the duration and lessen the severity of symptoms. Pain medicines may help  with fever, muscle aches, and throat pain. A variety of non-prescription medicines are available to treat congestion and runny nose. Your caregiver can make recommendations and may suggest nasal or lung inhalers for other symptoms.  HOME CARE INSTRUCTIONS   Only take over-the-counter or prescription medicines for pain, discomfort, or fever as directed by your caregiver.  Use a warm mist humidifier or inhale steam from a shower to increase air moisture. This may keep secretions moist and make it easier to breathe.  Drink enough water and fluids to keep your urine clear or pale yellow.  Rest as needed.  Return to work when your temperature has returned to normal or as your caregiver advises. You may need to stay home longer to avoid infecting others. You can also use a face mask and careful hand washing to prevent spread of the virus. SEEK MEDICAL CARE IF:   After the first few days, you feel you are getting worse rather than better.  You need your caregiver's advice about medicines to control symptoms.  You develop chills, worsening shortness of breath, or brown or red sputum. These may be signs of pneumonia.  You develop yellow or brown nasal discharge or pain in the face, especially when you bend forward. These may be signs of sinusitis.  You develop a fever, swollen neck glands, pain with swallowing, or white areas in the back of your throat. These may be signs of strep throat. SEEK IMMEDIATE MEDICAL CARE IF:   You have a fever.  You develop severe or persistent headache, ear pain, sinus pain, or chest pain.  You develop wheezing, a prolonged cough, cough up blood, or have a change in your usual mucus (if you have chronic lung disease).  You develop sore muscles or a stiff neck. Document Released: 07/10/2000 Document Revised: 04/08/2011 Document Reviewed: 05/18/2010 [facility] Patient Information 2014 [place].

## 2013-12-10 NOTE — Progress Notes (Signed)
   Subjective:    Patient ID: Jamie Tucker, female    DOB: February 07, 1965, 48 y.o.   MRN: [id number]  Sore Throat  This is a new problem. The current episode started in the past 7 days (3d). The problem has been gradually worsening. Neither side of throat is experiencing more pain than the other. There has been no fever. The pain is mild. Associated symptoms include congestion, coughing and headaches. Pertinent negatives include no abdominal pain, ear pain or shortness of breath. She has had exposure to strep. Exposure to: son.  Headache  Associated symptoms include coughing and a sore throat. Pertinent negatives include no abdominal pain, ear pain or fever.      Review of Systems  Constitutional: Negative for fever and fatigue.  HENT: Positive for congestion, postnasal drip and sore throat. Negative for ear pain.   Respiratory: Positive for cough. Negative for chest tightness and shortness of breath.   Cardiovascular: Negative for chest pain.  Gastrointestinal: Negative for abdominal pain.  Neurological: Positive for headaches.       Objective:   Physical Exam  Constitutional: She is oriented to person, place, and time. She appears well-developed and well-nourished. No distress.  HENT:  Head: Normocephalic and atraumatic.  Right Ear: External ear normal.  Mouth/Throat: Oropharynx is clear and moist. No oropharyngeal exudate.  Eyes: Conjunctivae are normal. Right eye exhibits no discharge. Left eye exhibits no discharge.  Neck: Normal range of motion. Neck supple. No thyromegaly present.  Cardiovascular: Normal rate, regular rhythm and normal heart sounds.   No murmur heard. Pulmonary/Chest: Effort normal and breath sounds normal. No respiratory distress. She has no wheezes. She has no rales.  Frequent coughing during exam   Lymphadenopathy:    She has no cervical adenopathy.  Neurological: She is alert and oriented to person, place, and time.  Skin: Skin is warm and dry.    Psychiatric: She has a normal mood and affect. Her behavior is normal. Thought content normal.  Vitals reviewed.         Assessment & Plan:  Upper respiratory infection with cough and congestion - Plan: benzonatate (TESSALON) 100 MG capsule sinus rinses See patient instructions for complete plan. F/u PRN

## 2013-12-13 ENCOUNTER — Telehealth: Payer: Self-pay | Admitting: Family Medicine

## 2013-12-13 ENCOUNTER — Other Ambulatory Visit: Payer: Self-pay | Admitting: Nurse Practitioner

## 2013-12-13 DIAGNOSIS — R059 Cough, unspecified: Secondary | ICD-10-CM

## 2013-12-13 DIAGNOSIS — R05 Cough: Secondary | ICD-10-CM

## 2013-12-13 MED ORDER — HYDROCODONE-HOMATROPINE 5-1.5 MG/5ML PO SYRP: 5.0000 mL | ORAL_SOLUTION | Freq: Every evening | ORAL | Status: DC | PRN

## 2013-12-13 NOTE — Telephone Encounter (Signed)
Spoke with pt, advised message from [place]. Pt understood. She will pick up Rx at front desk.

## 2013-12-13 NOTE — Telephone Encounter (Signed)
Patient is no better. Patient still coughing a lot. Please call. She feels like she has bronchitis.

## 2013-12-13 NOTE — Telephone Encounter (Signed)
Please advise 

## 2013-12-13 NOTE — Telephone Encounter (Signed)
I will prescribe hydrocodone cough syrup. Bronchitis is a viral illness. Antibiotics will not help her. She may cough for 3 to 4 weeks. She does not need to be seen again unless she is febrile or has chest pain with inspiration. She can pick up script at desk. She needs to continue instructions I gave her in office.

## 2013-12-14 ENCOUNTER — Other Ambulatory Visit: Payer: Self-pay | Admitting: Family Medicine

## 2013-12-14 DIAGNOSIS — R05 Cough: Secondary | ICD-10-CM

## 2013-12-14 DIAGNOSIS — R059 Cough, unspecified: Secondary | ICD-10-CM

## 2013-12-14 MED ORDER — HYDROCODONE-HOMATROPINE 5-1.5 MG/5ML PO SYRP: 5.0000 mL | ORAL_SOLUTION | Freq: Every evening | ORAL | Status: DC | PRN

## 2013-12-15 ENCOUNTER — Encounter: Payer: Self-pay | Admitting: Family Medicine

## 2014-01-27 ENCOUNTER — Telehealth: Payer: Self-pay

## 2014-01-27 MED ORDER — CLONAZEPAM 1 MG PO TABS: ORAL_TABLET | ORAL | Status: DC

## 2014-01-27 NOTE — Telephone Encounter (Signed)
Rx faxed

## 2014-01-27 NOTE — Telephone Encounter (Signed)
Pt requesting refill on her Clonazepam. Last OV 12/10/2013. [facility].

## 2014-01-27 NOTE — Telephone Encounter (Signed)
Clonaz rx printed. 

## 2014-02-04 ENCOUNTER — Other Ambulatory Visit: Payer: Self-pay | Admitting: Family Medicine

## 2014-02-04 NOTE — Telephone Encounter (Signed)
Pt LMOM stating she needed new RX for adderall.  After review of her chart, pt should have RX for adderall w/ fill date 01/31/14.  Left detailed message on pt's cell.

## 2014-02-04 NOTE — Telephone Encounter (Signed)
Pt LMOM apologizing that she forgot about her other RX.

## 2014-03-07 ENCOUNTER — Telehealth: Payer: Self-pay | Admitting: Family Medicine

## 2014-03-07 MED ORDER — AMPHETAMINE-DEXTROAMPHETAMINE 20 MG PO TABS: 20.0000 mg | ORAL_TABLET | Freq: Two times a day (BID) | ORAL | Status: DC

## 2014-03-07 NOTE — Telephone Encounter (Signed)
Patient requesting rf of adderall.   Patient last OV was 12/10/13.  Last RX printed for fill on or after 01/31/14.  Please advise rf.

## 2014-03-07 NOTE — Telephone Encounter (Signed)
Patient to p/u on Wednesday.

## 2014-03-07 NOTE — Telephone Encounter (Signed)
adderall rx printed as per pt request.

## 2014-03-09 ENCOUNTER — Encounter: Payer: Self-pay | Admitting: Family Medicine

## 2014-03-17 ENCOUNTER — Other Ambulatory Visit: Payer: Self-pay | Admitting: Family Medicine

## 2014-03-17 MED ORDER — FLUOXETINE HCL 20 MG PO TABS: 60.0000 mg | ORAL_TABLET | Freq: Every day | ORAL | Status: DC

## 2014-04-07 ENCOUNTER — Telehealth: Payer: Self-pay | Admitting: Family Medicine

## 2014-04-07 MED ORDER — AMPHETAMINE-DEXTROAMPHETAMINE 20 MG PO TABS: 20.0000 mg | ORAL_TABLET | Freq: Two times a day (BID) | ORAL | Status: DC

## 2014-04-07 NOTE — Telephone Encounter (Signed)
Patient aware.

## 2014-04-07 NOTE — Telephone Encounter (Signed)
Adderall rx printed. Pt must have routine office f/u sometime in the next 4 wks in order to get any FURTHER adderall Rx's.-thx

## 2014-04-07 NOTE — Telephone Encounter (Signed)
Pt LMOM stating that she was almost out of her adderall.  Patient is due for f/u.  Patient last OV was 12/03/13.  Last OV was 03/07/14.  Please advise rf.

## 2014-04-07 NOTE — Telephone Encounter (Signed)
LM for pt to CB

## 2014-04-26 ENCOUNTER — Encounter: Payer: Self-pay | Admitting: Family Medicine

## 2014-04-26 ENCOUNTER — Ambulatory Visit (INDEPENDENT_AMBULATORY_CARE_PROVIDER_SITE_OTHER): Payer: BLUE CROSS/BLUE SHIELD | Admitting: Family Medicine

## 2014-04-26 VITALS — BP 108/76 | HR 90 | Temp 98.2°F | Ht 62.0 in | Wt 101.0 lb

## 2014-04-26 DIAGNOSIS — F900 Attention-deficit hyperactivity disorder, predominantly inattentive type: Secondary | ICD-10-CM

## 2014-04-26 DIAGNOSIS — F909 Attention-deficit hyperactivity disorder, unspecified type: Secondary | ICD-10-CM

## 2014-04-26 DIAGNOSIS — F411 Generalized anxiety disorder: Secondary | ICD-10-CM | POA: Diagnosis not present

## 2014-04-26 DIAGNOSIS — F32A Depression, unspecified: Secondary | ICD-10-CM

## 2014-04-26 DIAGNOSIS — F329 Major depressive disorder, single episode, unspecified: Secondary | ICD-10-CM

## 2014-04-26 MED ORDER — CLONAZEPAM 1 MG PO TABS: ORAL_TABLET | ORAL | Status: DC

## 2014-04-26 MED ORDER — AMPHETAMINE-DEXTROAMPHETAMINE 20 MG PO TABS: 20.0000 mg | ORAL_TABLET | Freq: Two times a day (BID) | ORAL | Status: DC

## 2014-04-26 NOTE — Progress Notes (Signed)
Pre visit review using our clinic review tool, if applicable. No additional management support is needed unless otherwise documented below in the visit note. 

## 2014-04-26 NOTE — Progress Notes (Signed)
OFFICE NOTE  04/26/2014  CC:  Chief Complaint  Patient presents with  . Follow-up     HPI: Patient is a 49 y.o. Caucasian female who is here for 4 mo f/u adult ADD with hx of GAD and depression. Doing fine on ADD meds but notes that the irritability and stressed feelings she gets are better controlled by a 2mg  dose of clonazepam.  Denies depressed mood, hopelessness, crying spells, or SI/HI. Feels good, functioning well in her new job at [employer].  Minimal appetite suppression from adderall, no insomnia.   Pertinent PMH:  Past medical, surgical, social, and family history reviewed and no changes are noted since last office visit.  MEDS: Not taking hycodan or tessalon listed below Outpatient Prescriptions Prior to Visit  Medication Sig Dispense Refill  . amphetamine-dextroamphetamine (ADDERALL) 20 MG tablet Take 1 tablet (20 mg total) by mouth 2 (two) times daily. 60 tablet 0  . clonazePAM (KLONOPIN) 1 MG tablet Take 1-2 tabs po q12h prn anxiety 45 tablet 5  . fexofenadine (ALLEGRA) 180 MG tablet Take 180 mg by mouth daily. itching    . FLUoxetine (PROZAC) 20 MG tablet Take 3 tablets (60 mg total) by mouth daily. 90 tablet 1  . HYDROcodone-homatropine (HYCODAN) 5-1.5 MG/5ML syrup Take 5 mLs by mouth at bedtime as needed for cough. 120 mL 0  . benzonatate (TESSALON) 100 MG capsule Take 1-2 capsules po up to 3 times daily PRN cough 60 capsule 0   No facility-administered medications prior to visit.    PE: Blood pressure 108/76, pulse 90, temperature 98.2 F (36.8 C), temperature source Temporal, height 5\' 2"  (1.575 m), weight 101 lb (45.813 kg), last menstrual period 04/02/2014, SpO2 100 %. Wt Readings from Last 2 Encounters:  04/26/14 101 lb (45.813 kg)  12/10/13 104 lb (47.174 kg)    Gen: alert, oriented x 4, affect pleasant.  Lucid thinking and conversation noted. HEENT: PERRLA, EOMI.   Neck: no LAD, mass, or thyromegaly. CV: RRR, no m/r/g LUNGS: CTA bilat,  nonlabored. NEURO: no tremor or tics noted on observation.  Coordination intact. CN 2-12 grossly intact bilaterally, strength 5/5 in all extremeties.  No ataxia.   IMPRESSION AND PLAN:  1) Adult ADD: The current medical regimen is effective;  continue present plan and medications. I printed rx's for adderall 20mg  bid, #60 today for April, May, and June 2016.  Appropriate fill on/after date was noted on each rx.  Pt has controlled substance contract in chart and has had UDS in the past with appropriate results.  2) Anxiety and depression: The current medical regimen is effective;  continue present plan and medications. Changed clonaz monthly dispensation amount to #60, with 5 RFs--this rx replaces previous clonaz rx with dispense # of 45.  This will allow her a daily dosing of 2mg  when she feels this is necessary, also some days can do just 1mg  if not as anxious/irritable.  4) Prev health care: she reports having a CPE with fasting labs at her employer's health clinic in September 2015. She'll try to get us record of this.  She also has GYN f/u set for a couple of weeks per her report today.  An After Visit Summary was printed and given to the patient.  FOLLOW UP: 4 mo

## 2014-07-04 ENCOUNTER — Other Ambulatory Visit: Payer: Self-pay | Admitting: *Deleted

## 2014-07-04 NOTE — Telephone Encounter (Signed)
Pt LMOM stating that she lost her Rx for Adderall this month and needs a new one. [facility] [place] he stated that Rx was last filled on 5/8//16 and there are no Rx on hold. Please advise. Thanks.

## 2014-07-04 NOTE — Telephone Encounter (Signed)
Pt LMOM stating that she found her Rx and does not need a new one.

## 2014-07-05 NOTE — Telephone Encounter (Signed)
Per Wallace KellerHeather Kirby, CMA, pt called back and said she found her adderall rx for this month (June 2016).

## 2014-08-03 ENCOUNTER — Other Ambulatory Visit: Payer: Self-pay | Admitting: *Deleted

## 2014-08-03 NOTE — Telephone Encounter (Signed)
Pt LMOM requesting refill for Adderall. LOV: 04/26/14 NOV: None Last written: 04/26/14 #60 w/ [id number] Please advise. Thanks.

## 2014-08-04 ENCOUNTER — Other Ambulatory Visit: Payer: Self-pay | Admitting: Family Medicine

## 2014-08-04 MED ORDER — AMPHETAMINE-DEXTROAMPHETAMINE 20 MG PO TABS: 20.0000 mg | ORAL_TABLET | Freq: Two times a day (BID) | ORAL | Status: DC

## 2014-09-02 ENCOUNTER — Ambulatory Visit (INDEPENDENT_AMBULATORY_CARE_PROVIDER_SITE_OTHER): Payer: BLUE CROSS/BLUE SHIELD | Admitting: Family Medicine

## 2014-09-02 ENCOUNTER — Encounter: Payer: Self-pay | Admitting: Family Medicine

## 2014-09-02 VITALS — BP 109/79 | HR 90 | Temp 97.9°F | Resp 16 | Ht 62.0 in | Wt 99.0 lb

## 2014-09-02 DIAGNOSIS — F900 Attention-deficit hyperactivity disorder, predominantly inattentive type: Secondary | ICD-10-CM | POA: Diagnosis not present

## 2014-09-02 DIAGNOSIS — F411 Generalized anxiety disorder: Secondary | ICD-10-CM

## 2014-09-02 DIAGNOSIS — F909 Attention-deficit hyperactivity disorder, unspecified type: Secondary | ICD-10-CM

## 2014-09-02 MED ORDER — AMPHETAMINE-DEXTROAMPHETAMINE 20 MG PO TABS: 20.0000 mg | ORAL_TABLET | Freq: Two times a day (BID) | ORAL | Status: DC

## 2014-09-02 MED ORDER — FLUOXETINE HCL 20 MG PO TABS: 60.0000 mg | ORAL_TABLET | Freq: Every day | ORAL | Status: DC

## 2014-09-02 NOTE — Progress Notes (Signed)
OFFICE NOTE  09/02/2014  CC:  Chief Complaint  Patient presents with  . Follow-up    refill for medications     HPI: Patient is a 49 y.o. Caucasian female who is here for 4 mo f/u adult ADD and GAD. Feeling like all is stable with meds.  Focus/concentration good.  Some appetite suppression impeding intake of a good evening meal b/c she takes her 2nd dose of the day at 3 pm (1st dose around 5 AM).  No anxiety or depression issues currently. Going to [place] for her job for a week soon.    Pertinent PMH:  Past medical, surgical, social, and family history reviewed and no changes are noted since last office visit.  MEDS:  Outpatient Prescriptions Prior to Visit  Medication Sig Dispense Refill  . amphetamine-dextroamphetamine (ADDERALL) 20 MG tablet Take 1 tablet (20 mg total) by mouth 2 (two) times daily. 60 tablet 0  . clonazePAM (KLONOPIN) 1 MG tablet Take 1-2 tabs po q12h prn anxiety 60 tablet 5  . fexofenadine (ALLEGRA) 180 MG tablet Take 180 mg by mouth daily. itching    . FLUoxetine (PROZAC) 20 MG tablet Take 3 tablets (60 mg total) by mouth daily. 90 tablet 1   No facility-administered medications prior to visit.    PE: Blood pressure 109/79, pulse 90, temperature 97.9 F (36.6 C), temperature source Oral, resp. rate 16, height  (1.575 m), weight 99 lb (44.906 kg), last menstrual period 08/27/2014, SpO2 100 %. Wt Readings from Last 2 Encounters:  09/02/14 99 lb (44.906 kg)  04/26/14 101 lb (45.813 kg)    Gen: alert, oriented x 4, affect pleasant.  Lucid thinking and conversation noted. HEENT: PERRLA, EOMI.   Neck: no LAD, mass, or thyromegaly. CV: RRR, no m/r/g LUNGS: CTA bilat, nonlabored. NEURO: no tremor or tics noted on observation.  Coordination intact. CN 2-12 grossly intact bilaterally, strength 5/5 in all extremeties.  No ataxia.   IMPRESSION AND PLAN:  1) Adult ADD: change timing of 2nd daily dose to LUNCH to allow for time to wear off by  suppertime. I printed rx's for adderall  bid today for this month, September, and October 2016.  Appropriate fill on/after date was noted on each rx.  2) GAD/depression: The current medical regimen is effective;  continue present plan and medications.  An After Visit Summary was printed and given to the patient.  FOLLOW UP: 4 mo

## 2014-09-02 NOTE — Progress Notes (Signed)
Pre visit review using our clinic review tool, if applicable. No additional management support is needed unless otherwise documented below in the visit note. 

## 2014-10-25 ENCOUNTER — Other Ambulatory Visit: Payer: Self-pay | Admitting: *Deleted

## 2014-10-25 MED ORDER — CLONAZEPAM 1 MG PO TABS: ORAL_TABLET | ORAL | Status: DC

## 2014-10-25 NOTE — Telephone Encounter (Signed)
[facility]  [facility] for pt to p/u Rx at pharmacy.

## 2014-10-25 NOTE — Telephone Encounter (Signed)
Pt LMOM on 10/25/14 at 9:07am  RF request for clonazepam LOV: 09/02/14 Next ov:  None Last written: 04/26/14 #60 w/ 5RF  Please advise. Thanks.

## 2014-11-29 ENCOUNTER — Other Ambulatory Visit: Payer: Self-pay | Admitting: *Deleted

## 2014-11-29 ENCOUNTER — Ambulatory Visit: Payer: BLUE CROSS/BLUE SHIELD | Admitting: Family Medicine

## 2014-11-29 MED ORDER — AMPHETAMINE-DEXTROAMPHETAMINE 20 MG PO TABS: 20.0000 mg | ORAL_TABLET | Freq: Two times a day (BID) | ORAL | Status: DC

## 2014-11-29 NOTE — Telephone Encounter (Signed)
Left message for pt to call back  °

## 2014-11-29 NOTE — Telephone Encounter (Signed)
Rx printed. Pls ask pt to f/u in 1 mo.-thx

## 2014-11-29 NOTE — Telephone Encounter (Signed)
Pt called and stated that she only has 3 days left of her Adderall. She stated that they started training at work this week for a new software program they are using and she was unable to leave work to come in for her apt today. She stated that she can schedule an apt for next week.   RF request for Adderall LOV: 09/02/14 Next ov: 11/29/14 No showed Last written: 09/02/14 #60 w/ 0RF  Please advise. Thanks.

## 2014-11-29 NOTE — Telephone Encounter (Signed)
Pt advised and voiced understanding.   

## 2014-12-02 ENCOUNTER — Ambulatory Visit: Payer: BLUE CROSS/BLUE SHIELD | Admitting: Family Medicine

## 2014-12-29 ENCOUNTER — Encounter: Payer: Self-pay | Admitting: Family Medicine

## 2014-12-29 ENCOUNTER — Ambulatory Visit (INDEPENDENT_AMBULATORY_CARE_PROVIDER_SITE_OTHER): Payer: BLUE CROSS/BLUE SHIELD | Admitting: Family Medicine

## 2014-12-29 VITALS — BP 118/80 | HR 71 | Temp 98.0°F | Resp 16 | Ht 62.0 in | Wt 100.0 lb

## 2014-12-29 DIAGNOSIS — F32A Depression, unspecified: Secondary | ICD-10-CM

## 2014-12-29 DIAGNOSIS — F909 Attention-deficit hyperactivity disorder, unspecified type: Secondary | ICD-10-CM

## 2014-12-29 DIAGNOSIS — F900 Attention-deficit hyperactivity disorder, predominantly inattentive type: Secondary | ICD-10-CM | POA: Diagnosis not present

## 2014-12-29 DIAGNOSIS — F329 Major depressive disorder, single episode, unspecified: Secondary | ICD-10-CM | POA: Diagnosis not present

## 2014-12-29 MED ORDER — AMPHETAMINE-DEXTROAMPHETAMINE 20 MG PO TABS: 20.0000 mg | ORAL_TABLET | Freq: Two times a day (BID) | ORAL | Status: DC

## 2014-12-29 NOTE — Progress Notes (Signed)
Pre visit review using our clinic review tool, if applicable. No additional management support is needed unless otherwise documented below in the visit note. 

## 2014-12-29 NOTE — Progress Notes (Signed)
OFFICE VISIT  12/29/2014   CC:  Chief Complaint  Patient presents with  . Follow-up    Pt is not fasting.    HPI:    Patient is a 49 y.o. Caucasian female who presents for 4 mo adult ADHD, GAD/depression f/u. Her GYN visit was recently done, had a discussion with her about recent return of waxing/waning depression. Not happy in job or marriage.  She said the prozac had not been working--this was initially rx'd by her GYN. They elected to change her to lexapro and she is now on 1/2 of  qd, with plans to titrate to  after a week or so: started this 2 days ago.  She has info to look into regarding a Veterinary surgeon.  Focus/concentration some days not helped as well or as long on current adderall regimen, on 2 days she did take one additional adderall pill.     Past Medical History  Diagnosis Date  . History of ETT 06/2009    Normal/No ischemia  . Atypical chest pain 06/2009  . Alcoholism (HCC)     [facility] admission 2007 for acute Alc intox,; also detox [facility] 11/2005  . Tobacco dependence   . Depression   . Low back pain     Pt reports L/S x-ray that showed deg arth in the past  . History of mammogram     Normal, most recent 09/2007 (through her OB/GYN)  . History of abnormal Pap smear 11/2006    ASC-US, High risk HPV negative.  Follow up paps normal.  . Adult ADHD     Past Surgical History  Procedure Laterality Date  . Exercise treadmill test  06/2009    No ischemia  . Transthoracic echocardiogram  11/2009    Normal except grade I diastolic dysfunction  . Bunionectomy  1997    Left foot    Outpatient Prescriptions Prior to Visit  Medication Sig Dispense Refill  . clonazePAM (KLONOPIN) 1 MG tablet Take 1-2 tabs po q12h prn anxiety 60 tablet 5  . fexofenadine (ALLEGRA) 180 MG tablet Take 180 mg by mouth daily. itching    . amphetamine-dextroamphetamine (ADDERALL) 20 MG tablet Take 1 tablet (20 mg total) by mouth 2 (two) times daily. 60 tablet 0  . FLUoxetine (PROZAC) 20  MG tablet Take 3 tablets (60 mg total) by mouth daily. (Patient not taking: Reported on 12/29/2014) 90 tablet 12   No facility-administered medications prior to visit.    No Known Allergies  ROS As per HPI  PE: Blood pressure 118/80, pulse 71, temperature 98 F (36.7 C), temperature source Oral, resp. rate 16, height  (1.575 m), weight 100 lb (45.36 kg), last menstrual period 12/29/2014, SpO2 100 %. Wt Readings from Last 2 Encounters:  12/29/14 100 lb (45.36 kg)  09/02/14 99 lb (44.906 kg)    Gen: alert, oriented x 4, affect pleasant.  Lucid thinking and conversation noted. HEENT: PERRLA, EOMI.   Neck: no LAD, mass, or thyromegaly. CV: RRR, no m/r/g LUNGS: CTA bilat, nonlabored. NEURO: no tremor or tics noted on observation.  Coordination intact. CN 2-12 grossly intact bilaterally, strength 5/5 in all extremeties.  No ataxia.  LABS:  none  IMPRESSION AND PLAN:  1) Adult ADHD: The current medical regimen is effective;  continue present plan and medications. I printed rx's for adderall , 1 tab bid, #60 today for this month, Jan 2017, and feb 2017.  Appropriate fill on/after date was noted on each rx.  2) Depression/anxiety: doing  fine so far with recent switch to lexapro (after weening herself down on prozac dosing), plan to titrate up to 20mg  dosing after a week or so.   She will pursue counseling on her own.  An After Visit Summary was printed and given to the patient.  Spent 25 min with pt today, with >50% of this time spent in counseling and care coordination regarding the above problems.  FOLLOW UP: Return in about 3 months (around 03/29/2015) for f/u adhd/mood.

## 2015-01-12 ENCOUNTER — Encounter: Payer: Self-pay | Admitting: Family Medicine

## 2015-01-12 ENCOUNTER — Ambulatory Visit (INDEPENDENT_AMBULATORY_CARE_PROVIDER_SITE_OTHER): Payer: BLUE CROSS/BLUE SHIELD | Admitting: Family Medicine

## 2015-01-12 VITALS — BP 111/79 | HR 86 | Temp 98.2°F | Resp 16 | Wt 98.0 lb

## 2015-01-12 DIAGNOSIS — J069 Acute upper respiratory infection, unspecified: Secondary | ICD-10-CM

## 2015-01-12 DIAGNOSIS — J029 Acute pharyngitis, unspecified: Secondary | ICD-10-CM | POA: Diagnosis not present

## 2015-01-12 LAB — POCT RAPID STREP A (OFFICE): Rapid Strep A Screen: NEGATIVE

## 2015-01-12 NOTE — Progress Notes (Signed)
Pre visit review using our clinic review tool, if applicable. No additional management support is needed unless otherwise documented below in the visit note. 

## 2015-01-12 NOTE — Progress Notes (Signed)
OFFICE NOTE  01/12/2015  CC:  Chief Complaint  Patient presents with  . Sore Throat    x 2 days     HPI: Patient is a 49 y.o. Caucasian female who is here for sore throat.   Onset yesterday, very ST, +HA, stuffy/runny nose, sneezing some.  Not much cough. No fever.  Some diffuse LB achiness.  No rash.  Very tired. No meds taken.   Pertinent PMH:  Past medical, surgical, social, and family history reviewed and no changes are noted since last office visit.  MEDS:  Outpatient Prescriptions Prior to Visit  Medication Sig Dispense Refill  . amphetamine-dextroamphetamine (ADDERALL) 20 MG tablet Take 1 tablet (20 mg total) by mouth 2 (two) times daily. 60 tablet 0  . clonazePAM (KLONOPIN) 1 MG tablet Take 1-2 tabs po q12h prn anxiety 60 tablet 5  . escitalopram (LEXAPRO) 20 MG tablet Take 1 tablet by mouth daily.    . fexofenadine (ALLEGRA) 180 MG tablet Take 180 mg by mouth daily. itching     No facility-administered medications prior to visit.    PE: Blood pressure 111/79, pulse 86, temperature 98.2 F (36.8 C), temperature source Oral, resp. rate 16, weight 98 lb (44.453 kg), last menstrual period 12/29/2014, SpO2 100 %. VS: noted--normal. Gen: alert, NAD, NONTOXIC APPEARING. HEENT: eyes without injection, drainage, or swelling.  Ears: EACs clear, TMs with normal light reflex and landmarks.  Nose: Clear rhinorrhea, with some dried, crusty exudate adherent to mildly injected mucosa.  No purulent d/c.  No paranasal sinus TTP.  No facial swelling.  Throat and mouth without focal lesion.  No pharyngial swelling, erythema, or exudate.   Neck: supple, no LAD.   LUNGS: CTA bilat, nonlabored resps.   CV: RRR, no m/r/g. EXT: no c/c/e SKIN: no rash  Rapid strep: NEG  IMPRESSION AND PLAN:  Viral URI/pharyngitis. Symptomatic care discussed. Tylenol or ibup q6h prn, salt water gargle, chloraseptic spray prn. Sent group A strep culture.  An After Visit Summary was printed and  given to the patient.  FOLLOW UP: prn

## 2015-01-14 LAB — CULTURE, GROUP A STREP: Organism ID, Bacteria: NORMAL

## 2015-03-22 ENCOUNTER — Telehealth: Payer: Self-pay | Admitting: *Deleted

## 2015-03-22 ENCOUNTER — Ambulatory Visit: Payer: BLUE CROSS/BLUE SHIELD | Admitting: Family Medicine

## 2015-03-22 NOTE — Telephone Encounter (Signed)
sure

## 2015-03-22 NOTE — Telephone Encounter (Signed)
Pt No Showed apt at 8:00am. Do you want to charge pt a No Show for today? Please advise. Thanks.

## 2015-03-23 ENCOUNTER — Other Ambulatory Visit: Payer: Self-pay | Admitting: *Deleted

## 2015-03-23 NOTE — Telephone Encounter (Signed)
Pt LMOM on 03/23/15 at 8:16am stating that she forgot about her apt and would like to reschedule. Please call pt to schedule apt. Thanks.

## 2015-03-23 NOTE — Telephone Encounter (Signed)
If my calculations are correct, shouldn't she still have her 5th RF right now, and she would need a new rx NEXT month? Double check me.-thx

## 2015-03-23 NOTE — Telephone Encounter (Signed)
Pt LMOM on 03/23/15 at 8:16am stating that she needs refills.   RF request for clonazepam LOV: 12/29/14 Next ov: None - No showed apt 03/22/15, stated that she forgot on message. Last written: 10/25/14 #60 [facility]/ [facility]

## 2015-03-24 NOTE — Telephone Encounter (Signed)
Patient called back. She will reschedule appointment later

## 2015-03-24 NOTE — Telephone Encounter (Signed)
Spoke to Jamie Tucker at [facility] he stated that pt does not have any more refills.

## 2015-03-26 MED ORDER — CLONAZEPAM 1 MG PO TABS: ORAL_TABLET | ORAL | Status: DC

## 2015-03-26 NOTE — Telephone Encounter (Signed)
OK.  Clonazepam rx printed.

## 2015-03-27 NOTE — Telephone Encounter (Signed)
Rx faxed

## 2015-04-18 DIAGNOSIS — R922 Inconclusive mammogram: Secondary | ICD-10-CM | POA: Insufficient documentation

## 2015-09-22 ENCOUNTER — Other Ambulatory Visit: Payer: Self-pay | Admitting: Family Medicine

## 2015-09-22 MED ORDER — CLONAZEPAM 1 MG PO TABS: ORAL_TABLET | ORAL | 5 refills | Status: DC

## 2015-09-22 NOTE — Telephone Encounter (Signed)
Patient left message on machine 09/22/15 at 8:07am. Requesting refill for [facility]epam [facility] to [facility].  Rx last written 03/26/15 #60 with 5RF.  LOV: 12/29/2014  NOV: none  Patient states that she does not have insurance and no longer taking ADHD medication.  Please advise  Thanks.

## 2015-09-22 NOTE — Telephone Encounter (Signed)
Rx faxed

## 2016-02-22 DIAGNOSIS — Z967 Presence of other bone and tendon implants: Secondary | ICD-10-CM | POA: Diagnosis not present

## 2016-02-22 DIAGNOSIS — L02612 Cutaneous abscess of left foot: Secondary | ICD-10-CM | POA: Diagnosis not present

## 2016-02-22 DIAGNOSIS — M79672 Pain in left foot: Secondary | ICD-10-CM | POA: Diagnosis not present

## 2016-02-22 DIAGNOSIS — T8484XA Pain due to internal orthopedic prosthetic devices, implants and grafts, initial encounter: Secondary | ICD-10-CM | POA: Diagnosis not present

## 2016-02-22 DIAGNOSIS — M2012 Hallux valgus (acquired), left foot: Secondary | ICD-10-CM | POA: Diagnosis not present

## 2016-02-26 ENCOUNTER — Ambulatory Visit (INDEPENDENT_AMBULATORY_CARE_PROVIDER_SITE_OTHER): Payer: BLUE CROSS/BLUE SHIELD | Admitting: Family Medicine

## 2016-02-26 ENCOUNTER — Encounter: Payer: Self-pay | Admitting: Family Medicine

## 2016-02-26 VITALS — BP 110/77 | HR 75 | Temp 98.1°F | Resp 16 | Ht 62.0 in | Wt 97.2 lb

## 2016-02-26 DIAGNOSIS — F419 Anxiety disorder, unspecified: Secondary | ICD-10-CM

## 2016-02-26 DIAGNOSIS — F909 Attention-deficit hyperactivity disorder, unspecified type: Secondary | ICD-10-CM | POA: Diagnosis not present

## 2016-02-26 DIAGNOSIS — F329 Major depressive disorder, single episode, unspecified: Secondary | ICD-10-CM

## 2016-02-26 DIAGNOSIS — F418 Other specified anxiety disorders: Secondary | ICD-10-CM | POA: Diagnosis not present

## 2016-02-26 DIAGNOSIS — F32A Depression, unspecified: Secondary | ICD-10-CM

## 2016-02-26 MED ORDER — CLONAZEPAM 1 MG PO TABS: ORAL_TABLET | ORAL | 5 refills | Status: DC

## 2016-02-26 MED ORDER — AMPHETAMINE-DEXTROAMPHETAMINE 20 MG PO TABS: 20.0000 mg | ORAL_TABLET | Freq: Two times a day (BID) | ORAL | 0 refills | Status: DC

## 2016-02-26 MED ORDER — AMPHETAMINE-DEXTROAMPHETAMINE 20 MG PO TABS
20.0000 mg | ORAL_TABLET | Freq: Two times a day (BID) | ORAL | 0 refills | Status: DC
Start: 2016-02-26 — End: 2016-05-23

## 2016-02-26 NOTE — Progress Notes (Signed)
Pre visit review using our clinic review tool, if applicable. No additional management support is needed unless otherwise documented below in the visit note. 

## 2016-02-26 NOTE — Progress Notes (Signed)
OFFICE VISIT  02/26/2016   CC:  Chief Complaint  Patient presents with  . Follow-up    RCI,    HPI:    Patient is a 51 y.o. Caucasian female who presents for f/u adult ADHD.  I last saw her over a year ago. Due to anx/dep worsening she was changed from lexapro to wellbutrin about 2 mo ago by her gyn MD: on 150mg  dosing currently.  She stopped adderall 03/2015. She has been going through a lot of professional and personal turmoil in the last year.  She was crying while we talked today. Says life is hectic.  She is working part time at [employer] in a low paying job and this has her feeling down.  No SI or HI.  Says concentration is poor, energy level poor, motivation is down, says she can't complete anything she starts.  She has not been drinking alcohol or doing any drugs to self medicate. I reviewed the [place] Controlled substance online database today and there is no suspicious activity.  Past Medical History:  Diagnosis Date  . Adult ADHD   . Alcoholism (HCC)    [facility] ICU admission 2007 for acute Alc intox,; also detox at [facility] 11/2005  . Atypical chest pain 06/2009  . Depression   . History of abnormal Pap smear 11/2006   ASC-US, High risk HPV negative.  Follow up paps normal.  . History of ETT 06/2009   Normal/No ischemia  . History of mammogram    Normal, most recent 09/2007 (through her OB/GYN)  . Low back pain    Pt reports L/S x-ray that showed deg arth in the past  . Tobacco dependence     Past Surgical History:  Procedure Laterality Date  . BUNIONECTOMY  1997   Left foot  . exercise treadmill test  06/2009   No ischemia  . TRANSTHORACIC ECHOCARDIOGRAM  11/2009   Normal except grade I diastolic dysfunction    Outpatient Medications Prior to Visit  Medication Sig Dispense Refill  . fexofenadine (ALLEGRA) 180 MG tablet Take 180 mg by mouth daily. itching    . clonazePAM (KLONOPIN) 1 MG tablet Take 1-2 tabs po q12h prn anxiety 60 tablet 5  .  amphetamine-dextroamphetamine (ADDERALL) 20 MG tablet Take 1 tablet (20 mg total) by mouth 2 (two) times daily. (Patient not taking: Reported on 02/26/2016) 60 tablet 0  . escitalopram (LEXAPRO) 20 MG tablet Take 1 tablet by mouth daily.     No facility-administered medications prior to visit.     No Known Allergies  ROS As per HPI  PE: Blood pressure 110/77, pulse 75, temperature 98.1 F (36.7 C), temperature source Oral, resp. rate 16, height 5\' 2"  (1.575 m), weight 97 lb 4 oz (44.1 kg), last menstrual period 01/19/2016, SpO2 100 %. Wt Readings from Last 2 Encounters:  02/26/16 97 lb 4 oz (44.1 kg)  01/12/15 98 lb (44.5 kg)    Gen: alert, oriented x 4, affect pleasant but she was tearful on and off.  Lucid thinking and conversation noted. HEENT: PERRLA, EOMI.   Neck: no LAD, mass, or thyromegaly. CV: RRR, no m/r/g LUNGS: CTA bilat, nonlabored. NEURO: no tremor or tics noted on observation.  Coordination intact. CN 2-12 grossly intact bilaterally, strength 5/5 in all extremeties.  No ataxia.   LABS:  none  IMPRESSION AND PLAN:  1) Adult ADHD: she needs to restart her adderall 20mg  bid. I printed rx's for adderall IR 20mg , 1 bid, #60 today for  this month, Feb 2018, and Mar 2018.  Appropriate fill on/after date was noted on each rx.  2) Anxiety and depression: her GYN has been managing her meds for this. Agree with wellbutrin xl 150mg  qd---would not change right now since she is restarting her adderall now. I did RF pt's clonazepam 1mg , 1 tab bid prn, #60, RF x 5---handed this rx to her today.  An After Visit Summary was printed and given to the patient.  Spent 25 min with pt today, with >50% of this time spent in counseling and care coordination regarding the above problems.  FOLLOW UP: Return in about 4 months (around 06/25/2016) for f/u adult ADHD.  Signed:  Santiago BumpersPhil McGowen, MD           02/26/2016

## 2016-03-01 ENCOUNTER — Ambulatory Visit: Payer: BLUE CROSS/BLUE SHIELD | Admitting: Family Medicine

## 2016-03-08 DIAGNOSIS — F332 Major depressive disorder, recurrent severe without psychotic features: Secondary | ICD-10-CM | POA: Insufficient documentation

## 2016-03-08 DIAGNOSIS — F331 Major depressive disorder, recurrent, moderate: Secondary | ICD-10-CM | POA: Diagnosis not present

## 2016-03-08 DIAGNOSIS — Z1151 Encounter for screening for human papillomavirus (HPV): Secondary | ICD-10-CM | POA: Diagnosis not present

## 2016-03-08 DIAGNOSIS — Z01419 Encounter for gynecological examination (general) (routine) without abnormal findings: Secondary | ICD-10-CM | POA: Diagnosis not present

## 2016-03-08 LAB — HM PAP SMEAR: HM Pap smear: NORMAL

## 2016-04-08 ENCOUNTER — Ambulatory Visit: Payer: BLUE CROSS/BLUE SHIELD | Admitting: Family Medicine

## 2016-04-09 ENCOUNTER — Encounter: Payer: Self-pay | Admitting: Family Medicine

## 2016-04-09 ENCOUNTER — Ambulatory Visit (INDEPENDENT_AMBULATORY_CARE_PROVIDER_SITE_OTHER): Payer: BLUE CROSS/BLUE SHIELD | Admitting: Family Medicine

## 2016-04-09 VITALS — BP 95/61 | HR 88 | Temp 98.3°F | Resp 20 | Wt 96.5 lb

## 2016-04-09 DIAGNOSIS — R059 Cough, unspecified: Secondary | ICD-10-CM

## 2016-04-09 DIAGNOSIS — J40 Bronchitis, not specified as acute or chronic: Secondary | ICD-10-CM | POA: Diagnosis not present

## 2016-04-09 DIAGNOSIS — R05 Cough: Secondary | ICD-10-CM | POA: Diagnosis not present

## 2016-04-09 MED ORDER — AZITHROMYCIN 250 MG PO TABS: ORAL_TABLET | ORAL | 0 refills | Status: DC

## 2016-04-09 MED ORDER — HYDROCODONE-HOMATROPINE 5-1.5 MG/5ML PO SYRP: 5.0000 mL | ORAL_SOLUTION | Freq: Every evening | ORAL | 0 refills | Status: DC | PRN

## 2016-04-09 NOTE — Progress Notes (Signed)
Jamie Tucker , 12-Apr-1965, 51 y.o., female MRN: [id number] Patient Care Team    Relationship Specialty Notifications Start End  Jeoffrey MassedPhilip H McGowen, MD PCP - General Family Medicine  06/05/10   J. Jethro Polingurt Jacobs, MD Referring Physician Obstetrics and Gynecology  09/19/14     CC: cough  Subjective: Pt presents for an  OV with complaints of cough of 5 days duration.  Associated symptoms include increased phlegm production, chest soreness, hoarseness, fatigue. Possible subjective fever. Pt states she has tried Careers adviserallegra and ibuprofen. She was up all last night coughing and her chest was burning. She is an everyday smoker.   No flowsheet data found.  No Known Allergies Social History  Substance Use Topics  . Smoking status: Current Every Day Smoker    Packs/day: 0.50    Years: 20.00  . Smokeless tobacco: Never Used  . Alcohol use No   Past Medical History:  Diagnosis Date  . Adult ADHD   . Alcoholism (HCC)    [facility] ICU admission 2007 for acute Alc intox,; [facility]  . Atypical chest pain 06/2009  . Depression   . History of abnormal Pap smear 11/2006   ASC-US, High risk HPV negative.  Follow up paps normal.  . History of ETT 06/2009   Normal/No ischemia  . History of mammogram    Normal, most recent 09/2007 (through her OB/GYN)  . Low back pain    Pt reports L/S x-ray that showed deg arth in the past  . Tobacco dependence    Past Surgical History:  Procedure Laterality Date  . BUNIONECTOMY  1997   Left foot  . exercise treadmill test  06/2009   No ischemia  . TRANSTHORACIC ECHOCARDIOGRAM  11/2009   Normal except grade I diastolic dysfunction   Family History  Problem Relation Age of Onset  . Mitral valve prolapse Mother   . Heart disease Mother     MVP  . Heart disease Father     CABG in his 1850's, died age 50 of MI   Allergies as of 04/09/2016   No Known Allergies     Medication List       Accurate as of 04/09/16  2:58 PM. Always use your most recent med  list.          amphetamine-dextroamphetamine 20 MG tablet Commonly known as:  ADDERALL Take 1 tablet (20 mg total) by mouth 2 (two) times daily.   clonazePAM 1 MG tablet Commonly known as:  KLONOPIN Take 1-2 tabs po q12h prn anxiety   fexofenadine 180 MG tablet Commonly known as:  ALLEGRA Take 180 mg by mouth daily. itching   venlafaxine XR 75 MG 24 hr capsule Commonly known as:  EFFEXOR-XR Take 75 mg by mouth.       No results found for this or any previous visit (from the past 24 hour(s)). No results found.   ROS: Negative, with the exception of above mentioned in HPI   Objective:  BP 95/61 (BP Location: Right Arm, Patient Position: Sitting, Cuff Size: Normal)   Pulse 88   Temp 98.3 F (36.8 C)   Resp 20   Wt 96 lb 8 oz (43.8 kg)   SpO2 98%   BMI 17.65 kg/m  Body mass index is 17.65 kg/m. Gen: Afebrile. No acute distress. Nontoxic in appearance, well developed, well nourished. Thin caucasian female.  HENT: AT. [place]. Bilateral TM visualized with fullness. MMM, no oral lesions. Bilateral nares with erythema and  drainage. Throat without erythema or exudates. PND, hoarseness and cough present.  Eyes:Pupils Equal Round Reactive to light, Extraocular movements intact,  Conjunctiva without redness, discharge or icterus. Neck/lymp/endocrine: Supple,no lymphadenopathy CV: RRR, no edema Chest: CTAB, no wheeze or crackles. Good air movement, normal resp effort.  Abd: Soft. NTND. BS present.  Neuro: Normal gait. PERLA. EOMi. Alert. Oriented x3 Psych: crying. Tired.  Assessment/Plan: Jamie Tucker is a 51 y.o. female present for OV for  Cough - pt advised cough may continue up to 6 weeks after bronchitis resolved.  - cough drops, OTC therapy and mucinex DM - HYDROcodone-homatropine (HYCODAN) 5-1.5 MG/5ML syrup; Take 5 mLs by mouth at bedtime as needed for cough.  Dispense: 60 mL; Refill: 0  Bronchitis - z-pack prescribed. Rest, hydrate, mucinex DM.  - work note  provided.  - F/U PRN  Reviewed expectations re: course of current medical issues.  Discussed self-management of symptoms.  Outlined signs and symptoms indicating need for more acute intervention.  Patient verbalized understanding and all questions were answered.  Patient received an After-Visit Summary.   electronically signed by:  Felix Pacini, DO  [facility] - OR

## 2016-04-09 NOTE — Patient Instructions (Signed)
Rest, hydrate. Start Mucinex DM  Use cough syrup at night. Start azithromycin as prescribed.  Cough can last up to 6 weeks, use cough drops/OTC syrups/Mucinex DM    Acute Bronchitis, Adult Acute bronchitis is when air tubes (bronchi) in the lungs suddenly get swollen. The condition can make it hard to breathe. It can also cause these symptoms:  A cough.  Coughing up clear, yellow, or green mucus.  Wheezing.  Chest congestion.  Shortness of breath.  A fever.  Body aches.  Chills.  A sore throat. Follow these instructions at home: Medicines   Take over-the-counter and prescription medicines only as told by your doctor.  If you were prescribed an antibiotic medicine, take it as told by your doctor. Do not stop taking the antibiotic even if you start to feel better. General instructions   Rest.  Drink enough fluids to keep your pee (urine) clear or pale yellow.  Avoid smoking and secondhand smoke. If you smoke and you need help quitting, ask your doctor. Quitting will help your lungs heal faster.  Use an inhaler, cool mist vaporizer, or humidifier as told by your doctor.  Keep all follow-up visits as told by your doctor. This is important. How is this prevented? To lower your risk of getting this condition again:  Wash your hands often with soap and water. If you cannot use soap and water, use hand sanitizer.  Avoid contact with people who have cold symptoms.  Try not to touch your hands to your mouth, nose, or eyes.  Make sure to get the flu shot every year. Contact a doctor if:  Your symptoms do not get better in 2 weeks. Get help right away if:  You cough up blood.  You have chest pain.  You have very bad shortness of breath.  You become dehydrated.  You faint (pass out) or keep feeling like you are going to pass out.  You keep throwing up (vomiting).  You have a very bad headache.  Your fever or chills gets worse. This information is not  intended to replace advice given to you by your health care provider. Make sure you discuss any questions you have with your health care provider. Document Released: 07/03/2007 Document Revised: 08/23/2015 Document Reviewed: 07/05/2015 Elsevier Interactive Patient Education  2017 Elsevier Inc.   Cough, Adult A cough helps to clear your throat and lungs. A cough may last only 2-3 weeks (acute), or it may last longer than 8 weeks (chronic). Many different things can cause a cough. A cough may be a sign of an illness or another medical condition. Follow these instructions at home:  Pay attention to any changes in your cough.  Take medicines only as told by your doctor.  If you were prescribed an antibiotic medicine, take it as told by your doctor. Do not stop taking it even if you start to feel better.  Talk with your doctor before you try using a cough medicine.  Drink enough fluid to keep your pee (urine) clear or pale yellow.  If the air is dry, use a cold steam vaporizer or humidifier in your home.  Stay away from things that make you cough at work or at home.  If your cough is worse at night, try using extra pillows to raise your head up higher while you sleep.  Do not smoke, and try not to be around smoke. If you need help quitting, ask your doctor.  Do not have caffeine.  Do not drink  alcohol.  Rest as needed. Contact a doctor if:  You have new problems (symptoms).  You cough up yellow fluid (pus).  Your cough does not get better after 2-3 weeks, or your cough gets worse.  Medicine does not help your cough and you are not sleeping well.  You have pain that gets worse or pain that is not helped with medicine.  You have a fever.  You are losing weight and you do not know why.  You have night sweats. Get help right away if:  You cough up blood.  You have trouble breathing.  Your heartbeat is very fast. This information is not intended to replace advice given  to you by your health care provider. Make sure you discuss any questions you have with your health care provider. Document Released: 09/27/2010 Document Revised: 06/22/2015 Document Reviewed: 03/23/2014 Elsevier Interactive Patient Education  2017 [employer]

## 2016-05-23 ENCOUNTER — Ambulatory Visit (INDEPENDENT_AMBULATORY_CARE_PROVIDER_SITE_OTHER): Payer: BLUE CROSS/BLUE SHIELD | Admitting: Family Medicine

## 2016-05-23 ENCOUNTER — Encounter: Payer: Self-pay | Admitting: Family Medicine

## 2016-05-23 VITALS — BP 94/63 | HR 76 | Temp 97.6°F | Resp 16 | Ht 62.0 in | Wt 94.0 lb

## 2016-05-23 DIAGNOSIS — F329 Major depressive disorder, single episode, unspecified: Secondary | ICD-10-CM | POA: Diagnosis not present

## 2016-05-23 DIAGNOSIS — F988 Other specified behavioral and emotional disorders with onset usually occurring in childhood and adolescence: Secondary | ICD-10-CM

## 2016-05-23 DIAGNOSIS — F419 Anxiety disorder, unspecified: Secondary | ICD-10-CM | POA: Diagnosis not present

## 2016-05-23 DIAGNOSIS — F32A Depression, unspecified: Secondary | ICD-10-CM

## 2016-05-23 MED ORDER — AMPHETAMINE-DEXTROAMPHETAMINE 20 MG PO TABS: 20.0000 mg | ORAL_TABLET | Freq: Two times a day (BID) | ORAL | 0 refills | Status: DC

## 2016-05-23 NOTE — Progress Notes (Signed)
Pre visit review using our clinic review tool, if applicable. No additional management support is needed unless otherwise documented below in the visit note. 

## 2016-05-23 NOTE — Progress Notes (Signed)
OFFICE VISIT  05/23/2016   CC:  Chief Complaint  Patient presents with  . Follow-up    RCI,    HPI:    Patient is a 51 y.o. Caucasian female who presents for 3 mo f/u adult ADD, anxiety, and depression. Last visit she had been off adderall for 1 yr--life had been in turmoil basically.  We restarted her adderall at that time. She had also recently been switched to wellbutrin XL  as her antidepressant (by her GYN MD)--we made no changes in this at last visit. I refilled her clonazepam last visit.  Still feeling a lot of hurt/anger/frustration.  Now unemployed but looking for job OUT of the [employer]. Her GYN MD changed her antidepressant again in Feb: now on effexor XR  qd.  Says it is helping but she is still struggling--feels awful about herself.  She realizes her mood is largely secondary to her current bad life situation/circumstances. Takes clonazepam regularly: helps overall anxiety and feeling of being overwhelmed, helps constant negative thoughts.  Doing well on adderall: focus, concentration, motivation all doing well.  Taking  bid. No adverse side effects.  Past Medical History:  Diagnosis Date  . Adult ADHD   . Alcoholism (HCC)    [facility] ICU admission 2007 for acute Alc intox,; also detox at [facility] 11/2005  . Anxiety   . Atypical chest pain 06/2009  . Depression   . History of abnormal Pap smear 11/2006   ASC-US, High risk HPV negative.  Follow up paps normal.  . History of ETT 06/2009   Normal/No ischemia  . History of mammogram    Normal, most recent 09/2007 (through her OB/GYN)  . Low back pain    Pt reports L/S x-ray that showed deg arth in the past  . Tobacco dependence     Past Surgical History:  Procedure Laterality Date  . BUNIONECTOMY  1997   Left foot  . exercise treadmill test  06/2009   No ischemia  . TRANSTHORACIC ECHOCARDIOGRAM  11/2009   Normal except grade I diastolic dysfunction    Outpatient Medications Prior to Visit  Medication  Sig Dispense Refill  . clonazePAM (KLONOPIN) 1 MG tablet Take 1-2 tabs po q12h prn anxiety 60 tablet 5  . fexofenadine (ALLEGRA) 180 MG tablet Take 180 mg by mouth daily. itching    . venlafaxine XR (EFFEXOR-XR) 75 MG 24 hr capsule Take 75 mg by mouth.    Marland Kitchen amphetamine-dextroamphetamine (ADDERALL) 20 MG tablet Take 1 tablet (20 mg total) by mouth 2 (two) times daily. 60 tablet 0  . azithromycin (ZITHROMAX) 250 MG tablet 500 mg today, then 250 mg QD (Patient not taking: Reported on 05/23/2016) 6 tablet 0  . HYDROcodone-homatropine (HYCODAN) 5-1.5 MG/5ML syrup Take 5 mLs by mouth at bedtime as needed for cough. (Patient not taking: Reported on 05/23/2016) 60 mL 0   No facility-administered medications prior to visit.     No Known Allergies  ROS As per HPI  PE: Blood pressure 94/63, pulse 76, temperature 97.6 F (36.4 C), temperature source Oral, resp. rate 16, height  (1.575 m), weight 94 lb (42.6 kg), last menstrual period 05/18/2016, SpO2 100 %. Wt Readings from Last 2 Encounters:  05/23/16 94 lb (42.6 kg)  04/09/16 96 lb 8 oz (43.8 kg)    Gen: alert, oriented x 4, affect pleasant.  Lucid thinking and conversation noted. HEENT: PERRLA, EOMI.   Neck: no LAD, mass, or thyromegaly. CV: RRR, no m/r/g LUNGS: CTA bilat, nonlabored.  NEURO: no tremor or tics noted on observation.  Coordination intact. CN 2-12 grossly intact bilaterally, strength 5/5 in all extremeties.  No ataxia.  LABS:  Lab Results  Component Value Date   TSH 1.55 11/05/2012   Lab Results  Component Value Date   WBC 10.3 11/05/2012   HGB 12.7 11/05/2012   HCT 38.0 11/05/2012   MCV 90.0 11/05/2012   PLT 310.0 11/05/2012   Lab Results  Component Value Date   CREATININE 0.6 11/05/2012   BUN 17 11/05/2012   NA 136 11/05/2012   K 3.8 11/05/2012   CL 104 11/05/2012   CO2 24 11/05/2012   Lab Results  Component Value Date   ALT 13 11/05/2012   AST 13 11/05/2012   ALKPHOS 34 (L) 11/05/2012   BILITOT 0.4  11/05/2012    IMPRESSION AND PLAN:  1) Adult ADD: The current medical regimen is effective;  continue present plan and medications. I printed rx's for Adderall IR , 1 bid, #60 today for this month, May 2018, and June 2018.  Appropriate fill on/after date was noted on each rx.  2) Depression, largely situational at the current time.  She is working on getting her life back together, esp concerning her career.  Her GYN is managing her antidepressant, so she'll set up soon. Gave emotional support today.  3) Anxiety: stable on clonazepam.  No rx for this med needed today.  Needs fasting CPE--she'll return in 3 mo for this.  An After Visit Summary was printed and given to the patient.  FOLLOW UP: Return in about 3 months (around 08/22/2016) for annual CPE (fasting).  Signed:  Santiago Bumpers, MD           05/23/2016

## 2016-06-13 ENCOUNTER — Telehealth: Payer: Self-pay | Admitting: *Deleted

## 2016-06-13 MED ORDER — AMPHETAMINE-DEXTROAMPHETAMINE 20 MG PO TABS: ORAL_TABLET | ORAL | 0 refills | Status: DC

## 2016-06-13 NOTE — Telephone Encounter (Signed)
Will print new rx's for May and June. She needs to bring her current May and JUne rx's back to the office to be shredded. Also, tell her this is the MAXIMUM dosing of the medication that I am willing to do.-thx

## 2016-06-13 NOTE — Telephone Encounter (Signed)
Pt advised and voiced understanding. Rx's put up front with a note that she will need to return old Rx's for May and June.

## 2016-06-13 NOTE — Telephone Encounter (Signed)
Pt returned Rx's for May and June. Rx's were voided and put in shred box.

## 2016-06-13 NOTE — Telephone Encounter (Signed)
Pt LMOM on 06/13/16 at 8:10am requesting a call back in regards to her Adderall.   SW pt and she stated that she when she filled her prescription on 05/22/16 or 05/23/16 she started taking a 3rd tablet in the evening around 6pm. She stated that she did this because she wanted to see if it would help her feel better. She stated that she would feel fine during the day, but later in the evening she would start to feel down and had very little energy. She stated that she takes 1 tablet in the morning around 6am, 1 tablet mid-day around 1pm and 1 tablet in the evening around 6pm. She stated that the evening tablet is really helping with her depression. She wants to know if Dr. Milinda CaveMcGowen will write her a new Rx for 3 tablets daily. Please advise. Thanks.

## 2016-07-29 ENCOUNTER — Encounter: Payer: Self-pay | Admitting: Family Medicine

## 2016-07-29 ENCOUNTER — Ambulatory Visit: Payer: Self-pay | Admitting: Family Medicine

## 2016-07-29 ENCOUNTER — Telehealth: Payer: Self-pay | Admitting: *Deleted

## 2016-07-29 ENCOUNTER — Telehealth: Payer: Self-pay | Admitting: Family Medicine

## 2016-07-29 MED ORDER — CLONAZEPAM 1 MG PO TABS: ORAL_TABLET | ORAL | 0 refills | Status: DC

## 2016-07-29 NOTE — Telephone Encounter (Signed)
Called patient to see why she had missed her appt today she states she was too busy and had left a message she couldn't make appt. Patient is requesting Klonopin refill explained to patient she should follow up with her PCP for all controlled medications. She states she will run out this Friday and Dr Milinda CaveMcGowen is on vacation Patient offered appt to see Dr Claiborne BillingsKuneff to get refill she refused stating she shouldn't have to come in to get her refill. Explained to patient appt needed for all controlled medication refills. Patient hung up on me.

## 2016-07-29 NOTE — Telephone Encounter (Signed)
Spoke with patient reviewed policy information with patient . Advised her Rx for 7 day supply sent to her pharmacy.

## 2016-07-29 NOTE — Telephone Encounter (Signed)
Patient had made an appt for today to have refills on her meds. She made this appt by echart and no showed. On call for no show, she stated she needed her klonopin refilled and did not have time to come in the office because She [place]  It was explained to her this is a controlled substance, no matter the dosage ( she stated its "only 1 mg"), it is controlled and she needs to follow with her pcp when she reschedules the no show appt. Pt was angry and hung-up on the staff.  She then called back to the office stating she needs a refill because her father was taken off life support. Patient has enough to last until Friday if taking medication appropriately.  I will call in a 7 day script of klonopin. She will need to schedule with her PCP for refills and this will not be allowed in the future.  In the future, She should schedule her appt far enough in advanced for routine visits for her controlled substances.

## 2016-07-29 NOTE — Telephone Encounter (Signed)
Patient called stating that her dad was just taken off life support and she needs her klonopin refilled because she states she can't get into the office.  Patient was very upset, talking very loudly, and crying because it is "just one mg of klonopin"  I again offered her an appointment and she refused and said she needs DR Claiborne BillingsKuneff or her nurse to call her back asap.

## 2016-08-12 ENCOUNTER — Ambulatory Visit: Payer: Self-pay | Admitting: Family Medicine

## 2016-08-13 ENCOUNTER — Other Ambulatory Visit: Payer: Self-pay | Admitting: *Deleted

## 2016-08-13 MED ORDER — CLONAZEPAM 1 MG PO TABS: ORAL_TABLET | ORAL | 1 refills | Status: DC

## 2016-08-13 NOTE — Telephone Encounter (Signed)
Rx faxed

## 2016-08-13 NOTE — Telephone Encounter (Signed)
OK, noted

## 2016-08-13 NOTE — Telephone Encounter (Signed)
[facility]  RF request for clonazepam LOV: 05/23/16 Next ov: None Last written: 07/29/16 #14 w/ 0RF  Pt has apt yesterday but has to cancel her apt. She left a vm up front stating that she received a [place] yesterday morning [place] stating [place] were [place] her father off of life support. [place]. She stated that when she gets back she will reschedule her apt.

## 2016-08-20 ENCOUNTER — Encounter: Payer: Self-pay | Admitting: Family Medicine

## 2016-08-20 ENCOUNTER — Ambulatory Visit (INDEPENDENT_AMBULATORY_CARE_PROVIDER_SITE_OTHER): Payer: BLUE CROSS/BLUE SHIELD | Admitting: Family Medicine

## 2016-08-20 VITALS — BP 107/74 | HR 91 | Temp 98.3°F | Resp 16 | Ht 62.0 in | Wt 100.5 lb

## 2016-08-20 DIAGNOSIS — F4320 Adjustment disorder, unspecified: Secondary | ICD-10-CM | POA: Diagnosis not present

## 2016-08-20 DIAGNOSIS — F4321 Adjustment disorder with depressed mood: Secondary | ICD-10-CM

## 2016-08-20 DIAGNOSIS — F909 Attention-deficit hyperactivity disorder, unspecified type: Secondary | ICD-10-CM | POA: Diagnosis not present

## 2016-08-20 DIAGNOSIS — F411 Generalized anxiety disorder: Secondary | ICD-10-CM

## 2016-08-20 DIAGNOSIS — F330 Major depressive disorder, recurrent, mild: Secondary | ICD-10-CM | POA: Diagnosis not present

## 2016-08-20 MED ORDER — AMPHETAMINE-DEXTROAMPHETAMINE 20 MG PO TABS: ORAL_TABLET | ORAL | 0 refills | Status: DC

## 2016-08-20 MED ORDER — CLONAZEPAM 1 MG PO TABS: ORAL_TABLET | ORAL | 5 refills | Status: DC

## 2016-08-20 NOTE — Progress Notes (Signed)
OFFICE VISIT  08/20/2016   CC:  Chief Complaint  Patient presents with  . Follow-up    RCI   HPI:    Patient is a 51 y.o. Caucasian female who presents for 3 mo f/u adult ADD and anxiety/depression. Last visit she was stable on her adderall. She was depressed--largely situational, and her GYN has been managing her meds for this---GYN had changed her wellbutrin to venlafaxine xr. We did not change this last visit. We kept her on her clonazepam that she had been taking regularly for her anxiety.  Unfortunately, her father passed away today.  He had been on a vent x 1 mo after having a MI and then CVA. She has been prepared for it, is coping well at this moment.  Good support network of friends/family. She has undergone EXCESSIVE stress over the last several months leading up to his death, though. Appetite and food intake have remained good, and she has maintained her wt (up 6 lbs since last f/u 3 mo ago).  Says her focus/concentration/motivation all stable/doing well on adderall 20mg  tid. Says her generalized anxiety and her depressed mood are still pretty problematic. She asks if she can start getting the depression mgmt through me instead of GYn for convenience of getting eval/treatment/follow up by one provider.  I said this would be fine. She is questioning her actual antidepressant, for some reason is unsure if venlafaxine is her current med. She will go home and check her pill bottle to clarify.  If it is venlafaxine xr, we'll increase her dose to 150 mg qd. Continue clonazepam: she has 1 RF left on current rx--says this really helps her.    Past Medical History:  Diagnosis Date  . Adult ADHD   . Alcoholism (HCC)    [facility] ICU admission 2007 for acute Alc intox,; also detox at [facility] 11/2005  . Anxiety   . Atypical chest pain 06/2009  . Depression   . History of abnormal Pap smear 11/2006   ASC-US, High risk HPV negative.  Follow up paps normal.  . History of ETT 06/2009   Normal/No ischemia  . History of mammogram    Normal, most recent 09/2007 (through her OB/GYN)  . Low back pain    Pt reports L/S x-ray that showed deg arth in the past  . Tobacco dependence     Past Surgical History:  Procedure Laterality Date  . BUNIONECTOMY  1997   Left foot  . exercise treadmill test  06/2009   No ischemia  . TRANSTHORACIC ECHOCARDIOGRAM  11/2009   Normal except grade I diastolic dysfunction    Outpatient Medications Prior to Visit  Medication Sig Dispense Refill  . fexofenadine (ALLEGRA) 180 MG tablet Take 180 mg by mouth daily. itching    . venlafaxine XR (EFFEXOR-XR) 75 MG 24 hr capsule Take 75 mg by mouth.    Marland Kitchen amphetamine-dextroamphetamine (ADDERALL) 20 MG tablet 1 tab po tid 90 tablet 0  . amphetamine-dextroamphetamine (ADDERALL) 20 MG tablet 1 tab po tid 90 tablet 0  . clonazePAM (KLONOPIN) 1 MG tablet Take 1-2 tabs po q12h prn anxiety 60 tablet 1   No facility-administered medications prior to visit.     No Known Allergies  ROS As per HPI  PE: Blood pressure 107/74, pulse 91, temperature 98.3 F (36.8 C), temperature source Oral, resp. rate 16, height 5\' 2"  (1.575 m), weight 100 lb 8 oz (45.6 kg), last menstrual period 07/22/2016, SpO2 97 %. Wt Readings from Last 2 Encounters:  08/20/16 100 lb 8 oz (45.6 kg)  05/23/16 94 lb (42.6 kg)    Gen: alert, oriented x 4, affect pleasant.  Lucid thinking and conversation noted. HEENT: PERRLA, EOMI.   Neck: no LAD, mass, or thyromegaly. CV: RRR, no m/r/g LUNGS: CTA bilat, nonlabored. NEURO: no tremor or tics noted on observation.  Coordination intact. CN 2-12 grossly intact bilaterally, strength 5/5 in all extremeties.  No ataxia.   LABS:  none  IMPRESSION AND PLAN:  1) Adult ADHD: stable.  The current medical regimen is effective;  continue present plan and medications. I printed rx's for adderall 20mg  tid, #90 for this month, August 2018, and September 2018.  Appropriate fill on/after date  was noted on each rx.  2) GAD with recurrent MDD--mild, w/out psychotic features. Some response to venlafaxine XR 75mg  qd, but needs dose increase to 150mg  qd or 75mg  bid. She will call with confirmation of her med and we'll go from there.  3) Grief response: doing fine since father passed today. Fortunately she has been preparing for this so that helps.  Gave emotional support today.  An After Visit Summary was printed and given to the patient.  FOLLOW UP: Return for annual CPE (fasting) at patient's earliest convenience.  Signed:  Santiago BumpersPhil McGowen, MD           08/20/2016

## 2016-08-28 ENCOUNTER — Encounter: Payer: BLUE CROSS/BLUE SHIELD | Admitting: Family Medicine

## 2016-08-28 NOTE — Progress Notes (Deleted)
Office Note 08/28/2016  CC: No chief complaint on file.   HPI:  Jamie Tucker is a 51 y.o. White female who is here for annual health maintenance exam. GYN MD: Dr. Christella HartiganJacobs Last mammo:2011 Last pap: 2012   Past Medical History:  Diagnosis Date  . Adult ADHD   . Alcoholism (HCC)    [facility],; [facility]  . Anxiety   . Atypical chest pain 06/2009  . Depression   . History of abnormal Pap smear 11/2006   ASC-US, High risk HPV negative.  Follow up paps normal.  . History of ETT 06/2009   Normal/No ischemia  . History of mammogram    Normal, most recent 09/2007 (through her OB/GYN)  . Low back pain    Pt reports L/S x-ray that showed deg arth in the past  . Tobacco dependence     Past Surgical History:  Procedure Laterality Date  . BUNIONECTOMY  1997   Left foot  . exercise treadmill test  06/2009   No ischemia  . TRANSTHORACIC ECHOCARDIOGRAM  11/2009   Normal except grade I diastolic dysfunction    Family History  Problem Relation Age of Onset  . Mitral valve prolapse Mother   . Heart disease Mother        MVP  . Heart disease Father        CABG in his 9550's, died age 50 of MI    Social History   Social History  . Marital status: Married    Spouse name: N/A  . Number of children: 2  . Years of education: N/A   Occupational History  .  [employer]   Social History Main Topics  . Smoking status: Current Every Day Smoker    Packs/day: 0.50    Years: 20.00  . Smokeless tobacco: Never Used  . Alcohol use No  . Drug use: No  . Sexual activity: Not on file   Other Topics Concern  . Not on file   Social History Narrative   Married, 2 sons (ages 626y and 9y---[place]).   Occupation: worked in [employer] ([employer]) for many years.  Unemployed as of 04/2016.   [place]   Tobacco: 20+ yrs of 1/2 pack q 2-3 day.   ETOH abuse in past: sober x 3 yrs as of 05/2010.   No illegal drug abuse.    Outpatient Medications Prior to Visit  Medication Sig Dispense Refill  . amphetamine-dextroamphetamine (ADDERALL) 20 MG tablet 1 tab po tid 90 tablet 0  . clonazePAM (KLONOPIN) 1 MG tablet Take 1-2 tabs po q12h prn anxiety 60 tablet 5  . fexofenadine (ALLEGRA) 180 MG tablet Take 180 mg by mouth daily. itching    . venlafaxine XR (EFFEXOR-XR) 75 MG 24 hr capsule Take 75 mg by mouth.     No facility-administered medications prior to visit.     No Known Allergies  ROS *** PE; There were no vitals taken for this visit. *** Pertinent labs:  Lab Results  Component Value Date   TSH 1.55 11/05/2012   Lab Results  Component Value Date   WBC 10.3 11/05/2012   HGB 12.7 11/05/2012   HCT 38.0 11/05/2012   MCV 90.0 11/05/2012   PLT 310.0 11/05/2012   Lab Results  Component Value Date   CREATININE 0.6 11/05/2012   BUN 17 11/05/2012   NA 136 11/05/2012   K 3.8 11/05/2012  CL 104 11/05/2012   CO2 24 11/05/2012   Lab Results  Component Value Date   ALT 13 11/05/2012   AST 13 11/05/2012   ALKPHOS 34 (L) 11/05/2012   BILITOT 0.4 11/05/2012   No results found for: CHOL No results found for: HDL No results found for: LDLCALC No results found for: TRIG No results found for: CHOLHDL   ASSESSMENT AND PLAN:   Health maintenance exam: Reviewed age and gender appropriate health maintenance issues (prudent diet, regular exercise, health risks of tobacco and excessive alcohol, use of seatbelts, fire alarms in home, use of sunscreen).  Also reviewed age and gender appropriate health screening as well as vaccine recommendations. Vaccines: UTD. Labs: fasting HP labs today. Breast cancer screening: Cerv ca screening: Colon ca screening: pt due for initial screening colonoscopy after 08/2017, when she turns 51.  An After Visit Summary was printed and given to the patient.  FOLLOW UP:  No Follow-up on file.  Signed:  Santiago BumpersPhil Eirene Rather, MD           08/28/2016

## 2016-09-11 ENCOUNTER — Encounter: Payer: BLUE CROSS/BLUE SHIELD | Admitting: Family Medicine

## 2016-09-13 ENCOUNTER — Telehealth: Payer: Self-pay | Admitting: Family Medicine

## 2016-09-13 MED ORDER — VENLAFAXINE HCL ER 150 MG PO CP24: 150.0000 mg | ORAL_CAPSULE | Freq: Every day | ORAL | 3 refills | Status: DC

## 2016-09-13 NOTE — Telephone Encounter (Signed)
Please advise. Thanks.  

## 2016-09-13 NOTE — Telephone Encounter (Signed)
OK, generic effexor xr 150mg  eRx'd.

## 2016-09-13 NOTE — Telephone Encounter (Signed)
Left message on cell vm stating that medication requested has been sent to pts pharmacy.

## 2016-09-13 NOTE — Telephone Encounter (Signed)
Left message for pt to call back. Need to confirm what does effexor she is taking.

## 2016-09-13 NOTE — Telephone Encounter (Signed)
Patient is currently taking effexor 75mg  2 times daily and she thought Dr Milinda Cave was going to change it to 150mg  once daily.

## 2016-09-13 NOTE — Telephone Encounter (Signed)
Patient states Dr. Milinda Cave agreed to take over Effexor Rx. She is almost out. Can a Rx be sent to [facility]?

## 2016-09-17 DIAGNOSIS — T84293A Other mechanical complication of internal fixation device of bones of foot and toes, initial encounter: Secondary | ICD-10-CM | POA: Diagnosis not present

## 2016-09-18 ENCOUNTER — Ambulatory Visit: Payer: Self-pay | Admitting: Podiatry

## 2016-10-14 DIAGNOSIS — F332 Major depressive disorder, recurrent severe without psychotic features: Secondary | ICD-10-CM | POA: Diagnosis not present

## 2016-10-22 DIAGNOSIS — Z1231 Encounter for screening mammogram for malignant neoplasm of breast: Secondary | ICD-10-CM | POA: Diagnosis not present

## 2016-11-18 ENCOUNTER — Ambulatory Visit (INDEPENDENT_AMBULATORY_CARE_PROVIDER_SITE_OTHER): Payer: BLUE CROSS/BLUE SHIELD | Admitting: Family Medicine

## 2016-11-18 ENCOUNTER — Encounter: Payer: Self-pay | Admitting: Family Medicine

## 2016-11-18 VITALS — BP 97/63 | HR 78 | Temp 98.0°F | Resp 16 | Ht 62.0 in | Wt 100.5 lb

## 2016-11-18 DIAGNOSIS — Z23 Encounter for immunization: Secondary | ICD-10-CM | POA: Diagnosis not present

## 2016-11-18 DIAGNOSIS — F988 Other specified behavioral and emotional disorders with onset usually occurring in childhood and adolescence: Secondary | ICD-10-CM

## 2016-11-18 DIAGNOSIS — F411 Generalized anxiety disorder: Secondary | ICD-10-CM

## 2016-11-18 DIAGNOSIS — F331 Major depressive disorder, recurrent, moderate: Secondary | ICD-10-CM

## 2016-11-18 MED ORDER — AMPHETAMINE-DEXTROAMPHETAMINE 20 MG PO TABS: ORAL_TABLET | ORAL | 0 refills | Status: DC

## 2016-11-18 NOTE — Progress Notes (Signed)
OFFICE VISIT  11/18/2016   CC:  Chief Complaint  Patient presents with  . Follow-up    RCI   HPI:    Patient is a 51 y.o. Caucasian female who presents for f/u adult ADHD, recurrent MDD, and GAD. Still focusing and concentrating well on adderall, takes it tid every day.  Organizational skills improved on this med. No side effects felt.  Psychiatrist and counselor referrals are in the process of being set up.  Psych [place]. Not clear in future if I'll still be rx'ing clonazepam for her, but will likely still be rx'ing her adderall.  Her GYN increased her to 3 of the 75mg  effexor XR qd about 1 mo ago--pt cannot really tell any difference.  She is in the early phases of menopause--"to add to ally my troubles".  States poor support network at home, has trouble getting a job, and when she gets one, she finds it VERY hard to make herself go. Has interview for a job tomorrow.  She is no better regarding her chronic depression and anxiety, has "had it up to here" for the last 2 yrs.  Is ready to make transition to psychiatrist in hopes of better answers.  No HI or SI.  She is not drinking alcohol: 10 yr anniversary of sobriety is coming up soon.  Past Medical History:  Diagnosis Date  . Adult ADHD   . Alcoholism (HCC)    [facility],; [facility]  . Anxiety   . Atypical chest pain 06/2009  . Depression   . History of abnormal Pap smear 11/2006   ASC-US, High risk HPV negative.  Follow up paps normal.  . History of ETT 06/2009   Normal/No ischemia  . History of mammogram    Normal, most recent 09/2007 (through her OB/GYN)  . Low back pain    Pt reports L/S x-ray that showed deg arth in the past  . Tobacco dependence     Past Surgical History:  Procedure Laterality Date  . BUNIONECTOMY  1997   Left foot  . exercise treadmill test  06/2009   No ischemia  . TRANSTHORACIC ECHOCARDIOGRAM  11/2009   Normal except grade I diastolic  dysfunction    Outpatient Medications Prior to Visit  Medication Sig Dispense Refill  . clonazePAM (KLONOPIN) 1 MG tablet Take 1-2 tabs po q12h prn anxiety 60 tablet 5  . fexofenadine (ALLEGRA) 180 MG tablet Take 180 mg by mouth daily. itching    . amphetamine-dextroamphetamine (ADDERALL) 20 MG tablet 1 tab po tid 90 tablet 0  . venlafaxine XR (EFFEXOR-XR) 150 MG 24 hr capsule Take 1 capsule (150 mg total) by mouth daily with breakfast. (Patient not taking: Reported on 11/18/2016) 30 capsule 3   No facility-administered medications prior to visit.     No Known Allergies  ROS As per HPI  PE: Blood pressure 97/63, pulse 78, temperature 98 F (36.7 C), temperature source Oral, resp. rate 16, height 5\' 2"  (1.575 m), weight 100 lb 8 oz (45.6 kg), last menstrual period 10/17/2016, SpO2 100 %. Wt Readings from Last 2 Encounters:  11/18/16 100 lb 8 oz (45.6 kg)  08/20/16 100 lb 8 oz (45.6 kg)    Gen: alert, oriented x 4, affect pleasant.  Lucid thinking and conversation noted. HEENT: PERRLA, EOMI.   Neck: no LAD, mass, or thyromegaly. CV: RRR, no m/r/g LUNGS: CTA bilat, nonlabored. NEURO: no tremor or tics noted on observation.  Coordination intact. CN 2-12 grossly intact bilaterally, strength 5/5 in all extremeties.  No ataxia.   LABS:  none  IMPRESSION AND PLAN:  1) Adult ADD: The current medical regimen is effective;  continue present plan and medications. I printed rx's for adderall 20mg , 1 tid, #90 today for this month, Nov, and Dec 2018.  Appropriate fill on/after date was noted on each rx. At next f/u visit we should be definite about ongoing rx'ing of this med, and if I am still the one monitoring this condition and rx'ing the med, I'll update her CSC and discuss UDS.  2) MDD, recurrent, moderate episode, no psychotic features.  Not improving with current therapy (effexor xr 225 mg qd). She is getting [place] through her GYN and also getting set up  with counseling.  3) GAD: not well controlled.  Continue clonazepam at current dosing.  No new rx needed today. See #1 and #2 above.  An After Visit Summary was printed and given to the patient.  FOLLOW UP: Return for routine chronic illness f/u.  Signed:  Santiago Bumpers, MD           11/18/2016

## 2016-11-18 NOTE — Addendum Note (Signed)
Addended by: Smitty KnudsenSUTHERLAND, HEATHER K on: 11/18/2016 03:03 PM   Modules accepted: Orders

## 2016-12-03 DIAGNOSIS — L570 Actinic keratosis: Secondary | ICD-10-CM | POA: Diagnosis not present

## 2016-12-03 DIAGNOSIS — L280 Lichen simplex chronicus: Secondary | ICD-10-CM | POA: Diagnosis not present

## 2017-02-12 ENCOUNTER — Other Ambulatory Visit: Payer: Self-pay | Admitting: *Deleted

## 2017-02-12 ENCOUNTER — Other Ambulatory Visit: Payer: Self-pay | Admitting: Family Medicine

## 2017-02-12 MED ORDER — AMPHETAMINE-DEXTROAMPHETAMINE 20 MG PO TABS: ORAL_TABLET | ORAL | 0 refills | Status: DC

## 2017-02-12 NOTE — Telephone Encounter (Signed)
Copied from CRM [id number]. Topic: Quick Communication - See Telephone Encounter >> Feb 12, 2017 11:32 AM Arlyss Gandyichardson, Yvon Mccord N, NT wrote: CRM for notification. See Telephone encounter for: Pt has an appt on 02/26/17 for a refill  of her adderall but she is flying out of town this evening and will not return until 02/24/17 but would like to see if she can have a refill enough to last her until her appt?   02/12/17.

## 2017-02-12 NOTE — Telephone Encounter (Signed)
Rx put up front for p/u. Left detailed message on cell vm, okay per DPR.  

## 2017-02-12 NOTE — Telephone Encounter (Signed)
Pt was seen in October 2018 for f/u and was given 3 Rx's 1 for October, 1 for November and 1 for December. She has f/u apt scheduled for 02/26/17, she is flying out of town this evening and would like to see if she could get an early Rx to get her by til her apt. Please advise. Thanks.

## 2017-02-12 NOTE — Telephone Encounter (Signed)
Rx put up front for pt to p/u.   Left detailed message on pts cell vm, okay per DPR.

## 2017-02-12 NOTE — Telephone Encounter (Signed)
Requesting refill of Adderall. Refill is not due until 01/16/18 but pt is going out of town this evening and returns 02/24/17. Asking to refill enough meds for duration she is out of town. LOV 11/18/17 with Dr. Milinda CaveMcGowen.

## 2017-02-12 NOTE — Telephone Encounter (Signed)
Patient is calling back in regards to this. Would like to know if it would be done before closing time because she is flying out. Please advise. Thank you.

## 2017-02-13 NOTE — Telephone Encounter (Signed)
Rx picked up by pt this morning.

## 2017-02-25 ENCOUNTER — Encounter: Payer: Self-pay | Admitting: *Deleted

## 2017-02-26 ENCOUNTER — Ambulatory Visit: Payer: BLUE CROSS/BLUE SHIELD | Admitting: Family Medicine

## 2017-02-26 NOTE — Progress Notes (Deleted)
OFFICE VISIT  02/26/2017   CC: No chief complaint on file.    HPI:    Patient is a 52 y.o. Caucasian female who presents for f/u adult ADD and generalized anxiety/situational anxiety which we treat with prn clonazepam 1mg  (1-2 bid prn). Last visit she was struggling with increased anxiety and MDD, was awaiting set up with psychiatrist, counselor as well. I did not change her dose of antidepressant that she was on at that time [facility](EFFEXOR XR 225mg  qd).  ADD:  MOOD:   Anxiety:  Past Medical History:  Diagnosis Date  . Adult ADHD   . Alcoholism (HCC)    [facility] for [facility] Alc intox,; [facility]  . Anxiety   . Atypical chest pain 06/2009  . Depression   . History of abnormal Pap smear 11/2006   ASC-US, High risk HPV negative.  Follow up paps normal.  . History of ETT 06/2009   Normal/No ischemia  . History of mammogram    Normal, most recent 09/2007 (through her OB/GYN)  . Low back pain    Pt reports L/S x-ray that showed deg arth in the past  . Tobacco dependence     Past Surgical History:  Procedure Laterality Date  . BUNIONECTOMY  1997   Left foot  . exercise treadmill test  06/2009   No ischemia  . TRANSTHORACIC ECHOCARDIOGRAM  11/2009   Normal except grade I diastolic dysfunction    Outpatient Medications Prior to Visit  Medication Sig Dispense Refill  . amphetamine-dextroamphetamine (ADDERALL) 20 MG tablet 1 tab po tid 90 tablet 0  . clonazePAM (KLONOPIN) 1 MG tablet Take 1-2 tabs po q12h prn anxiety 60 tablet 5  . fexofenadine (ALLEGRA) 180 MG tablet Take 180 mg by mouth daily. itching    . venlafaxine XR (EFFEXOR-XR) 75 MG 24 hr capsule Take 225 mg by mouth daily.     No facility-administered medications prior to visit.     No Known Allergies  ROS As per HPI  PE: There were no vitals taken for this visit. ***  LABS:    Chemistry      Component Value Date/Time   NA 136 11/05/2012 0931   K 3.8 11/05/2012 0931   CL 104  11/05/2012 0931   CO2 24 11/05/2012 0931   BUN 17 11/05/2012 0931   CREATININE 0.6 11/05/2012 0931   CREATININE 0.62 10/02/2010 1507      Component Value Date/Time   CALCIUM 9.5 11/05/2012 0931   ALKPHOS 34 (L) 11/05/2012 0931   AST 13 11/05/2012 0931   ALT 13 11/05/2012 0931   BILITOT 0.4 11/05/2012 0931       IMPRESSION AND PLAN:  No problem-specific Assessment & Plan notes found for this encounter.  Needs CSC and UDS.  An After Visit Summary was printed and given to the patient.  FOLLOW UP: No Follow-up on file.  Signed:  Santiago BumpersPhil Leimomi Zervas, MD           02/26/2017

## 2017-02-27 ENCOUNTER — Ambulatory Visit: Payer: BLUE CROSS/BLUE SHIELD | Admitting: Family Medicine

## 2017-02-27 NOTE — Progress Notes (Deleted)
OFFICE VISIT  02/27/2017   CC: No chief complaint on file.    HPI:    Patient is a 52 y.o. Caucasian female who presents for 3 mo f/u adult ADD, MDD, and GAD. Last visit I kept her on her adderall, effexor xr, and clonazepam w/out any changes. She was in process of getting in with a psychiatrist and counselor b/c of MDD.  Past Medical History:  Diagnosis Date  . Adult ADHD   . Alcoholism (HCC)    [facility],; [facility]  . Anxiety   . Atypical chest pain 06/2009  . Depression   . History of abnormal Pap smear 11/2006   ASC-US, High risk HPV negative.  Follow up paps normal.  . History of ETT 06/2009   Normal/No ischemia  . History of mammogram    Normal, most recent 09/2007 (through her OB/GYN)  . Low back pain    Pt reports L/S x-ray that showed deg arth in the past  . Tobacco dependence     Past Surgical History:  Procedure Laterality Date  . BUNIONECTOMY  1997   Left foot  . exercise treadmill test  06/2009   No ischemia  . TRANSTHORACIC ECHOCARDIOGRAM  11/2009   Normal except grade I diastolic dysfunction    Outpatient Medications Prior to Visit  Medication Sig Dispense Refill  . amphetamine-dextroamphetamine (ADDERALL) 20 MG tablet 1 tab po tid 90 tablet 0  . clonazePAM (KLONOPIN) 1 MG tablet Take 1-2 tabs po q12h prn anxiety 60 tablet 5  . fexofenadine (ALLEGRA) 180 MG tablet Take 180 mg by mouth daily. itching    . venlafaxine XR (EFFEXOR-XR) 75 MG 24 hr capsule Take 225 mg by mouth daily.     No facility-administered medications prior to visit.     No Known Allergies  ROS As per HPI  PE: There were no vitals taken for this visit. ***  LABS:  ***  IMPRESSION AND PLAN:  No problem-specific Assessment & Plan notes found for this encounter.   FOLLOW UP: No Follow-up on file.

## 2017-03-05 ENCOUNTER — Ambulatory Visit: Payer: BLUE CROSS/BLUE SHIELD | Admitting: Family Medicine

## 2017-03-05 NOTE — Progress Notes (Deleted)
OFFICE VISIT  03/05/2017   CC: No chief complaint on file.    HPI:    Patient is a 52 y.o. Caucasian female who presents for f/u adult ADD, anxiety and depression. I last saw her about 3 and 1/2 mo ago and she was stable on adderall 20mg  tid as far as her ADD sx's go.  She was struggling a lot with depression and was in the process of getting in with a psychiatrist. Clonazepam continued for anxiety.  Past Medical History:  Diagnosis Date  . Adult ADHD   . Alcoholism (HCC)    [facility],; [facility]  . Anxiety   . Atypical chest pain 06/2009  . Depression   . History of abnormal Pap smear 11/2006   ASC-US, High risk HPV negative.  Follow up paps normal.  . History of ETT 06/2009   Normal/No ischemia  . History of mammogram    Normal, most recent 09/2007 (through her OB/GYN)  . Low back pain    Pt reports L/S x-ray that showed deg arth in the past  . Tobacco dependence     Past Surgical History:  Procedure Laterality Date  . BUNIONECTOMY  1997   Left foot  . exercise treadmill test  06/2009   No ischemia  . TRANSTHORACIC ECHOCARDIOGRAM  11/2009   Normal except grade I diastolic dysfunction    Outpatient Medications Prior to Visit  Medication Sig Dispense Refill  . amphetamine-dextroamphetamine (ADDERALL) 20 MG tablet 1 tab po tid 90 tablet 0  . clonazePAM (KLONOPIN) 1 MG tablet Take 1-2 tabs po q12h prn anxiety 60 tablet 5  . fexofenadine (ALLEGRA) 180 MG tablet Take 180 mg by mouth daily. itching    . venlafaxine XR (EFFEXOR-XR) 75 MG 24 hr capsule Take 225 mg by mouth daily.     No facility-administered medications prior to visit.     No Known Allergies  ROS As per HPI  PE: There were no vitals taken for this visit. ***  LABS:    Chemistry      Component Value Date/Time   NA 136 11/05/2012 0931   K 3.8 11/05/2012 0931   CL 104 11/05/2012 0931   CO2 24 11/05/2012 0931   BUN 17 11/05/2012 0931   CREATININE 0.6 11/05/2012 0931   CREATININE 0.62 10/02/2010 1507      Component Value Date/Time   CALCIUM 9.5 11/05/2012 0931   ALKPHOS 34 (L) 11/05/2012 0931   AST 13 11/05/2012 0931   ALT 13 11/05/2012 0931   BILITOT 0.4 11/05/2012 0931       IMPRESSION AND PLAN:  No problem-specific Assessment & Plan notes found for this encounter.   FOLLOW UP: No Follow-up on file.

## 2017-03-13 ENCOUNTER — Encounter: Payer: Self-pay | Admitting: Family Medicine

## 2017-03-13 ENCOUNTER — Ambulatory Visit: Payer: BLUE CROSS/BLUE SHIELD | Admitting: Family Medicine

## 2017-03-13 VITALS — BP 113/79 | HR 85 | Temp 98.3°F | Wt 100.0 lb

## 2017-03-13 DIAGNOSIS — F909 Attention-deficit hyperactivity disorder, unspecified type: Secondary | ICD-10-CM | POA: Diagnosis not present

## 2017-03-13 DIAGNOSIS — F411 Generalized anxiety disorder: Secondary | ICD-10-CM | POA: Diagnosis not present

## 2017-03-13 DIAGNOSIS — F331 Major depressive disorder, recurrent, moderate: Secondary | ICD-10-CM | POA: Diagnosis not present

## 2017-03-13 MED ORDER — AMPHETAMINE-DEXTROAMPHETAMINE 20 MG PO TABS: ORAL_TABLET | ORAL | 0 refills | Status: DC

## 2017-03-13 MED ORDER — CLONAZEPAM 1 MG PO TABS: ORAL_TABLET | ORAL | 5 refills | Status: DC

## 2017-03-13 NOTE — Progress Notes (Signed)
OFFICE VISIT  03/13/2017   CC:  Chief Complaint  Patient presents with  . Follow-up    ADD, refill medications   HPI:    Patient is a 52 y.o. Caucasian female who presents for f/u adult ADD. No changes in ADD med was made at last f/u visit a little over 3 mo ago. Most recent adderall RF rx printed 02/12/17.   She was suffering still from significant depression at last f/u, and this had been persisting for a couple years and she was set up to see a psychiatrist at that time. She was on venlafaxine xr 225mg  qd at the time.  Clonaz 1mg , 1-2 bid prn anxiety continued. She decided not to go to the specialist/psychiatrist in [place] after all, felt like the trips back and forth would have been too much.  She wants referral to our counselor, Dr. Dewayne HatchMendelson. Still not working. Reading self help books lately.  She is still struggling a lot with significant depression: lots of sadness, guilt, poor energy, not wanting to even get out of bed some days.  Anxiety is not consistently bothering her but has some life circumstances that get her almost panicky at times. Husband not supportive, criticizes her for not being able to hold a job or get a new one. She describes a VERY tense relationship between her and her husband. Pt states effexor has helped some with depression, particularly around the time of her menses. She is trying to get more exercise to help the depression.  Pt states all is going well with the med at current dosing: much improved focus, concentration, task completion.  Less frustration, better multitasking, less impulsivity and restlessness.  Mood is stable. No side effects from the medication.  [place] in [place].  Left the adderall there, is 2d away from needing RF. Has not taken adderall in the last 3 days.   Past Medical History:  Diagnosis Date  . Adult ADHD   . Alcoholism (HCC)    [facility],; [facility]  [facility]  . Anxiety   . Atypical chest pain 06/2009  . Depression   . History of abnormal Pap smear 11/2006   ASC-US, High risk HPV negative.  Follow up paps normal.  . History of ETT 06/2009   Normal/No ischemia  . History of mammogram    Normal, most recent 09/2007 (through her OB/GYN)  . Low back pain    Pt reports L/S x-ray that showed deg arth in the past  . Tobacco dependence     Past Surgical History:  Procedure Laterality Date  . BUNIONECTOMY  1997   Left foot  . exercise treadmill test  06/2009   No ischemia  . TRANSTHORACIC ECHOCARDIOGRAM  11/2009   Normal except grade I diastolic dysfunction    Outpatient Medications Prior to Visit  Medication Sig Dispense Refill  . fexofenadine (ALLEGRA) 180 MG tablet Take 180 mg by mouth daily. itching    . venlafaxine XR (EFFEXOR-XR) 75 MG 24 hr capsule Take 225 mg by mouth daily.    Marland Kitchen. amphetamine-dextroamphetamine (ADDERALL) 20 MG tablet 1 tab po tid 90 tablet 0  . clonazePAM (KLONOPIN) 1 MG tablet Take 1-2 tabs po q12h prn anxiety 60 tablet 5   No facility-administered medications prior to visit.     No Known Allergies  ROS As per HPI  PE: Blood pressure 113/79, pulse 85, temperature 98.3 F (36.8 C), temperature  source Oral, weight 100 lb (45.4 kg), SpO2 99 %. Wt Readings from Last 2 Encounters:  03/13/17 100 lb (45.4 kg)  11/18/16 100 lb 8 oz (45.6 kg)    Gen: alert, oriented x 4, affect pleasant.  Lucid thinking and conversation noted. HEENT: PERRLA, EOMI.   Neck: no LAD, mass, or thyromegaly. CV: RRR, no m/r/g LUNGS: CTA bilat, nonlabored. NEURO: no tremor or tics noted on observation.  Coordination intact. CN 2-12 grossly intact bilaterally, strength 5/5 in all extremeties.  No ataxia.   LABS:    Chemistry      Component Value Date/Time   NA 136 11/05/2012 0931   K 3.8 11/05/2012 0931   CL 104 11/05/2012 0931   CO2 24 11/05/2012 0931   BUN 17 11/05/2012 0931   CREATININE 0.6 11/05/2012 0931    CREATININE 0.62 10/02/2010 1507      Component Value Date/Time   CALCIUM 9.5 11/05/2012 0931   ALKPHOS 34 (L) 11/05/2012 0931   AST 13 11/05/2012 0931   ALT 13 11/05/2012 0931   BILITOT 0.4 11/05/2012 0931       IMPRESSION AND PLAN:  1) ADD: The current medical regimen is effective;  continue present plan and medications. Her last UDS was 12/03/13 and showed approp results. CSC 04/02/13 in chart--need to update at future visit. I printed rx's for adderall 20mg , 1 tid, #90, today for this month, March 2019, and April 2019.  Appropriate fill on/after date was noted on each rx.  2) MDD, recurrent, moderate episode, w/out psychotic features. Refer to Dr. Dewayne Hatch for counseling. Continue effexor xr 225mg  qd---she is not interested in changing med or adding med at this time.  3) GAD: continue venlafaxine xr and clonaz 1mg  bid. Faxed rx for clonaz to pharmacy today, #60, RF x 5--she'll call pharmacy to request RF when out of current RFs.  An After Visit Summary was printed and given to the patient.  FOLLOW UP: Return in about 3 months (around 06/10/2017) for f/u adult add/anx/dep.  Signed:  Santiago Bumpers, MD           03/13/2017

## 2017-04-14 ENCOUNTER — Ambulatory Visit: Payer: BLUE CROSS/BLUE SHIELD | Admitting: Clinical

## 2017-04-14 DIAGNOSIS — F331 Major depressive disorder, recurrent, moderate: Secondary | ICD-10-CM

## 2017-05-05 ENCOUNTER — Ambulatory Visit: Payer: BLUE CROSS/BLUE SHIELD | Admitting: Clinical

## 2017-05-28 ENCOUNTER — Ambulatory Visit: Payer: BLUE CROSS/BLUE SHIELD | Admitting: Clinical

## 2017-05-28 DIAGNOSIS — F331 Major depressive disorder, recurrent, moderate: Secondary | ICD-10-CM

## 2017-06-10 ENCOUNTER — Encounter: Payer: Self-pay | Admitting: Family Medicine

## 2017-06-10 ENCOUNTER — Ambulatory Visit: Payer: BLUE CROSS/BLUE SHIELD | Admitting: Family Medicine

## 2017-06-10 VITALS — BP 109/75 | HR 78 | Temp 97.8°F | Resp 16 | Ht 62.0 in | Wt 95.5 lb

## 2017-06-10 DIAGNOSIS — F330 Major depressive disorder, recurrent, mild: Secondary | ICD-10-CM | POA: Diagnosis not present

## 2017-06-10 DIAGNOSIS — R636 Underweight: Secondary | ICD-10-CM

## 2017-06-10 DIAGNOSIS — F411 Generalized anxiety disorder: Secondary | ICD-10-CM

## 2017-06-10 DIAGNOSIS — F988 Other specified behavioral and emotional disorders with onset usually occurring in childhood and adolescence: Secondary | ICD-10-CM

## 2017-06-10 MED ORDER — AMPHETAMINE-DEXTROAMPHETAMINE 20 MG PO TABS: ORAL_TABLET | ORAL | 0 refills | Status: DC

## 2017-06-10 MED ORDER — VENLAFAXINE HCL ER 75 MG PO CP24: 225.0000 mg | ORAL_CAPSULE | Freq: Every day | ORAL | 3 refills | Status: DC

## 2017-06-10 NOTE — Progress Notes (Signed)
OFFICE VISIT  06/10/2017   CC:  Chief Complaint  Patient presents with  . Follow-up    RCI, pt is not fasting.     HPI:    Patient is a 52 y.o. Caucasian female who presents for 3 mo f/u adult ADD.  ADD: has been steady on adderall , 1 tid. Pt states all is going well with the med at current dosing: much improved focus, concentration, task completion.  Less frustration, better multitasking, less impulsivity and restlessness.  Mood is stable. No side effects from the medication.   Also has recurrent MDD, has been in moderate episode last year or so: we have been keeping venlafaxine dosing the same and and she has been seeking counseling--last o/v I referred her to our office psychologist Dr. Dewayne Hatch. GAD: venlafaxine and clonazepam-counseling referral last o/v. Extraneous life circumstances still playing a huge role: unemployed, financial issues.  Probs with husband.  She has been seeing Dr. Dewayne Hatch and says this is good, next appt 06/2017.   Past Medical History:  Diagnosis Date  . Adult ADHD   . Alcoholism (HCC)    [facility] ICU admission 2007 for acute Alc intox,; also detox at [facility] 11/2005  . Anxiety   . Atypical chest pain 06/2009  . Depression   . History of abnormal Pap smear 11/2006   ASC-US, High risk HPV negative.  Follow up paps normal.  . History of ETT 06/2009   Normal/No ischemia  . History of mammogram    Normal, most recent 09/2007 (through her OB/GYN)  . Low back pain    Pt reports L/S x-ray that showed deg arth in the past  . Tobacco dependence     Past Surgical History:  Procedure Laterality Date  . BUNIONECTOMY  1997   Left foot  . exercise treadmill test  06/2009   No ischemia  . TRANSTHORACIC ECHOCARDIOGRAM  11/2009   Normal except grade I diastolic dysfunction    Outpatient Medications Prior to Visit  Medication Sig Dispense Refill  . clonazePAM (KLONOPIN) 1 MG tablet Take 1-2 tabs po q12h prn anxiety 60 tablet 5  . fexofenadine (ALLEGRA)  180 MG tablet Take 180 mg by mouth daily. itching    . amphetamine-dextroamphetamine (ADDERALL) 20 MG tablet 1 tab po tid 90 tablet 0  . venlafaxine XR (EFFEXOR-XR) 75 MG 24 hr capsule Take 225 mg by mouth daily.     No facility-administered medications prior to visit.     No Known Allergies  ROS As per HPI  PE: Blood pressure 109/75, pulse 78, temperature 97.8 F (36.6 C), temperature source Oral, resp. rate 16, height  (1.575 m), weight 95 lb 8 oz (43.3 kg), last menstrual period 05/22/2017, SpO2 100 %. Wt Readings from Last 2 Encounters:  06/10/17 95 lb 8 oz (43.3 kg)  03/13/17 100 lb (45.4 kg)    Gen: alert, oriented x 4, affect pleasant.  Lucid thinking and conversation noted. HEENT: PERRLA, EOMI.   Neck: no LAD, mass, or thyromegaly. CV: RRR, no m/r/g LUNGS: CTA bilat, nonlabored. NEURO: no tremor or tics noted on observation.  Coordination intact. CN 2-12 grossly intact bilaterally, strength 5/5 in all extremeties.  No ataxia.   LABS:    Chemistry      Component Value Date/Time   NA 136 11/05/2012 0931   K 3.8 11/05/2012 0931   CL 104 11/05/2012 0931   CO2 24 11/05/2012 0931   BUN 17 11/05/2012 0931   CREATININE 0.6 11/05/2012 0931  CREATININE 0.62 10/02/2010 1507      Component Value Date/Time   CALCIUM 9.5 11/05/2012 0931   ALKPHOS 34 (L) 11/05/2012 0931   AST 13 11/05/2012 0931   ALT 13 11/05/2012 0931   BILITOT 0.4 11/05/2012 0931      IMPRESSION AND PLAN:  Adult ADD: The current medical regimen is effective;  continue present plan and medications. I printed rx's for adderall , 1 tid, #90,   today for this month, June 2019, July 2019.  Appropriate fill on/after date was noted on each rx. Update CSC and obtain UDS at next f/u appt.  2) GAD with chronic MDD: stable. RF'd venlafaxine today. She is not due for clonaz rx at this time. Continue counseling with Dr. Dewayne Hatch.  An After Visit Summary was printed and given to the  patient.  FOLLOW UP: Return in about 3 months (around 09/10/2017) for routine chronic illness f/u.  Signed:  Santiago Bumpers, MD           06/10/2017

## 2017-07-02 ENCOUNTER — Ambulatory Visit: Payer: BLUE CROSS/BLUE SHIELD | Admitting: Clinical

## 2017-07-06 DIAGNOSIS — N3001 Acute cystitis with hematuria: Secondary | ICD-10-CM | POA: Diagnosis not present

## 2017-08-06 ENCOUNTER — Telehealth: Payer: Self-pay | Admitting: Family Medicine

## 2017-08-06 NOTE — Telephone Encounter (Signed)
Copied from CRM [id number]. Topic: Quick Communication - See Telephone Encounter >> Aug 06, 2017  9:55 AM Mare LoanBurton, Donna F wrote: Pt is needing to get her adderall refilled today there is a possibilty that she will be going out of town tonight due the a family emrgency -the originial refill is  due Friday 08/08/17  [facility] [place] [facility]    Best number [phone]

## 2017-08-06 NOTE — Telephone Encounter (Signed)
RF now is fine. -thx

## 2017-08-06 NOTE — Telephone Encounter (Signed)
Please advise if early refill okay.

## 2017-08-06 NOTE — Telephone Encounter (Signed)
Contacted pharmacy.  Per pharmacist, due to RX stating "fill on or after 08/08/17" they are not allowed to take verbal authorization.  New RX needs to be sent.  Please advise.

## 2017-08-07 MED ORDER — AMPHETAMINE-DEXTROAMPHETAMINE 20 MG PO TABS: ORAL_TABLET | ORAL | 0 refills | Status: DC

## 2017-08-07 NOTE — Telephone Encounter (Signed)
Patient calling to check on the status of her Adderall prescription.

## 2017-08-07 NOTE — Telephone Encounter (Signed)
OK, adderall rx printed. 

## 2017-08-07 NOTE — Telephone Encounter (Signed)
Left message for patient to CB.  Patient needs to bring in old adderall RX in order to get new RX.

## 2017-08-08 NOTE — Telephone Encounter (Signed)
Old Rx was brought in by pt and placed on my desk.  I VOIDED Rx and put it in the shred-it bin.

## 2017-08-29 ENCOUNTER — Telehealth: Payer: Self-pay | Admitting: Family Medicine

## 2017-08-29 MED ORDER — CLONAZEPAM 1 MG PO TABS: ORAL_TABLET | ORAL | 5 refills | Status: DC

## 2017-08-29 NOTE — Telephone Encounter (Unsigned)
Copied from CRM [id number]. Topic: Quick Communication - Rx Refill/Question >> Aug 29, 2017 10:54 AM Gaynelle AduPoole, Shalonda wrote: Medication:  clonazePAM (KLONOPIN) 1 MG tablet Has the patient contacted their pharmacy? no  Preferred Pharmacy (with phone number or street name): [id number] [place], [place] - [phone] (Phone) [phone] (Fax)      Agent: Please be advised that RX refills may take up to 3 business days. We ask that you follow-up with your pharmacy.

## 2017-08-29 NOTE — Telephone Encounter (Signed)
Erx done. Pls remind pt of need for follow up appt in November this year.-thx

## 2017-08-29 NOTE — Telephone Encounter (Signed)
RF request for clonazepam.  Last RX 03/13/17 # 60 x 5 RFS. Last OV was 06/10/17 No upcoming appointment.   Patient also canceled appointment w/ Dr Dewayne HatchMendelson that was scheduld 07/02/17.   Please advise.

## 2017-09-02 ENCOUNTER — Other Ambulatory Visit: Payer: Self-pay | Admitting: Family Medicine

## 2017-09-02 MED ORDER — AMPHETAMINE-DEXTROAMPHETAMINE 20 MG PO TABS: ORAL_TABLET | ORAL | 0 refills | Status: DC

## 2017-09-02 NOTE — Telephone Encounter (Signed)
RF request for adderall LOV: 06/10/17 Next ov: 09/04/17 Last written: 08/07/17 #90 w/ 0RF  Please advise. Thanks.

## 2017-09-02 NOTE — Telephone Encounter (Signed)
Copied from CRM [id number]. Topic: Quick Communication - See Telephone Encounter >> Sep 02, 2017 10:11 AM Jolayne Hainesaylor, Brittany L wrote: CRM for notification. See Telephone encounter for: 09/02/17.  Patient wants to know could she get amphetamine-dextroamphetamine (ADDERALL) 20 MG tablet refilled before she comes in on Thursday for her appt? She said she only has 2 pills left, and she takes 3 a day. Please advise.  [id number] [place], [place] - [address] [place] [phone]

## 2017-09-04 ENCOUNTER — Ambulatory Visit: Payer: Self-pay | Admitting: Family Medicine

## 2017-09-04 DIAGNOSIS — Z0289 Encounter for other administrative examinations: Secondary | ICD-10-CM

## 2017-09-04 NOTE — Progress Notes (Deleted)
OFFICE VISIT  09/04/2017   CC: No chief complaint on file.    HPI:    Patient is a 52 y.o. Caucasian female who presents for f/u adult ADD, MDD, GAD.  ADD:  MDD and GAD: we've been continuing venlafaxine and clonazepam. At the time of last visit she was continuing with counseling with Dr. Dewayne HatchMendelson.  Past Medical History:  Diagnosis Date  . Adult ADHD   . Alcoholism (HCC)    [facility] for [facility] intox,; [facility]  . Anxiety   . Atypical chest pain 06/2009  . Depression   . History of abnormal Pap smear 11/2006   ASC-US, High risk HPV negative.  Follow up paps normal.  . History of ETT 06/2009   Normal/No ischemia  . History of mammogram    Normal, most recent 09/2007 (through her OB/GYN)  . Low back pain    Pt reports L/S x-ray that showed deg arth in the past  . Tobacco dependence     Past Surgical History:  Procedure Laterality Date  . BUNIONECTOMY  1997   Left foot  . exercise treadmill test  06/2009   No ischemia  . TRANSTHORACIC ECHOCARDIOGRAM  11/2009   Normal except grade I diastolic dysfunction    Outpatient Medications Prior to Visit  Medication Sig Dispense Refill  . amphetamine-dextroamphetamine (ADDERALL) 20 MG tablet 1 tab po tid 90 tablet 0  . clonazePAM (KLONOPIN) 1 MG tablet Take 1-2 tabs po q12h prn anxiety 60 tablet 5  . fexofenadine (ALLEGRA) 180 MG tablet Take 180 mg by mouth daily. itching    . venlafaxine XR (EFFEXOR-XR) 75 MG 24 hr capsule Take 3 capsules (225 mg total) by mouth daily. 270 capsule 3   No facility-administered medications prior to visit.     No Known Allergies  ROS As per HPI  PE: There were no vitals taken for this visit. ***  LABS:    Chemistry      Component Value Date/Time   NA 136 11/05/2012 0931   K 3.8 11/05/2012 0931   CL 104 11/05/2012 0931   CO2 24 11/05/2012 0931   BUN 17 11/05/2012 0931   CREATININE 0.6 11/05/2012 0931   CREATININE 0.62 10/02/2010 1507       Component Value Date/Time   CALCIUM 9.5 11/05/2012 0931   ALKPHOS 34 (L) 11/05/2012 0931   AST 13 11/05/2012 0931   ALT 13 11/05/2012 0931   BILITOT 0.4 11/05/2012 0931      IMPRESSION AND PLAN:  No problem-specific Assessment & Plan notes found for this encounter.   An After Visit Summary was printed and given to the patient.  FOLLOW UP: No follow-ups on file.  Signed:  Santiago BumpersPhil McGowen, MD           09/04/2017

## 2017-10-07 ENCOUNTER — Ambulatory Visit: Payer: BLUE CROSS/BLUE SHIELD | Admitting: Family Medicine

## 2017-10-15 ENCOUNTER — Ambulatory Visit: Payer: BLUE CROSS/BLUE SHIELD | Admitting: Family Medicine

## 2018-02-04 ENCOUNTER — Other Ambulatory Visit: Payer: Self-pay | Admitting: Family Medicine

## 2018-02-04 NOTE — Telephone Encounter (Signed)
RF request for clonazepam LOV: 06/10/17 Next ov: None Last written: 08/29/17 #60 w/ 5RF  Please advise. Thanks.

## 2018-02-05 NOTE — Telephone Encounter (Signed)
I will do 1 mo supply with 1 RF.  Due to new [facility] and state laws, I have to see her every 6 months to check up on this medication or I can't continue to prescribe it for her.  Needs office f/u sometime before March 2020.-thx

## 2018-02-05 NOTE — Telephone Encounter (Signed)
Spoke with patient to let her her know information below from PCP.  She stated verbal understanding and said she new that she was due for an appointment. She is going to check her schedule and call back for appointment.

## 2018-04-17 ENCOUNTER — Other Ambulatory Visit: Payer: Self-pay

## 2018-04-17 MED ORDER — CLONAZEPAM 1 MG PO TABS: ORAL_TABLET | ORAL | 0 refills | Status: DC

## 2018-04-17 NOTE — Telephone Encounter (Signed)
RF request for Klonazepam 1mg  tab LOV: 05/31/17 Next ov: none Last written: 02/05/18 #60 w/ 1 RF.  Medication is pending. Please advise, thanks.

## 2018-04-17 NOTE — Telephone Encounter (Signed)
Pt overdue for follow up for this med. I'll do #60, NO RF. Must be seen prior to any further RF's.-thx

## 2018-04-20 NOTE — Telephone Encounter (Signed)
Patient advised and voiced understanding.   Will call back before medication is out to schedule follow up visit.

## 2018-05-08 ENCOUNTER — Other Ambulatory Visit: Payer: Self-pay | Admitting: Family Medicine

## 2018-05-11 NOTE — Telephone Encounter (Signed)
Per last RF, patient needed appointment.  I contacted patient today to schedule virtual visit 05/13/2018.   I advised her no refill until appt was complete.

## 2018-05-11 NOTE — Telephone Encounter (Signed)
Thanks

## 2018-05-13 ENCOUNTER — Ambulatory Visit: Payer: BLUE CROSS/BLUE SHIELD | Admitting: Family Medicine

## 2018-05-13 NOTE — Progress Notes (Unsigned)
Virtual Visit via Video Note  I connected with pt  on 05/13/18 at 11:20 AM EDT by a video enabled telemedicine application and verified that I am speaking with the correct person using two identifiers.  Location patient: home Location provider:work or home office Persons participating in the virtual visit: patient, provider  I discussed the limitations of evaluation and management by telemedicine and the availability of in person appointments. The patient expressed understanding and agreed to proceed.  Telemedicine visit is a necessity given the COVID-19 restrictions in place at the current time.  HPI: 53 y/o WM here for f/u adult ADD and also GAD. She has recurrent MDD--last visit she was still struggling with depression, was seeing Dr. Dewayne HatchMendelson for counseling.  She was really having trouble with some significant extraneous life circumstances: unemployed, financial issues. probs with husband. I last saw her for f/u 06/10/2017.  We kept all her meds the same at that time. Since that time she has had a couple of cancelled appts and a no-show.    Interim hx: ***   ROS: See pertinent positives and negatives per HPI.  Past Medical History:  Diagnosis Date  . Adult ADHD   . Alcoholism (HCC)    [facility],; [facility]  . Anxiety   . Atypical chest pain 06/2009  . Depression   . History of abnormal Pap smear 11/2006   ASC-US, High risk HPV negative.  Follow up paps normal.  . History of ETT 06/2009   Normal/No ischemia  . History of mammogram    Normal, most recent 09/2007 (through her OB/GYN)  . Low back pain    Pt reports L/S x-ray that showed deg arth in the past  . Tobacco dependence     Past Surgical History:  Procedure Laterality Date  . BUNIONECTOMY  1997   Left foot  . exercise treadmill test  06/2009   No ischemia  . TRANSTHORACIC ECHOCARDIOGRAM  11/2009   Normal except grade I diastolic dysfunction    Family History   Problem Relation Age of Onset  . Mitral valve prolapse Mother   . Heart disease Mother        MVP  . Heart disease Father        CABG in his 6050's, died age 52 of MI     Current Outpatient Medications:  .  amphetamine-dextroamphetamine (ADDERALL) 20 MG tablet, 1 tab po tid, Disp: 90 tablet, Rfl: 0 .  clonazePAM (KLONOPIN) 1 MG tablet, TAKE 1-2 TABS BY MOUTH EVERY 12 HOURS AS NEEDED ANXIETY, Disp: 60 tablet, Rfl: 0 .  fexofenadine (ALLEGRA) 180 MG tablet, Take 180 mg by mouth daily. itching, Disp: , Rfl:  .  venlafaxine XR (EFFEXOR-XR) 75 MG 24 hr capsule, Take 3 capsules (225 mg total) by mouth daily., Disp: 270 capsule, Rfl: 3  EXAM:  VITALS per patient if applicable:  GENERAL: alert, oriented, appears well and in no acute distress  HEENT: atraumatic, conjunttiva clear, no obvious abnormalities on inspection of external nose and ears  NECK: normal movements of the head and neck  LUNGS: on inspection no signs of respiratory distress, breathing rate appears normal, no obvious gross SOB, gasping or wheezing  CV: no obvious cyanosis  MS: moves all visible extremities without noticeable abnormality  PSYCH/NEURO: pleasant and cooperative, no obvious depression or anxiety, speech and thought processing grossly intact  LABS: none today    Chemistry      Component Value  Date/Time   NA 136 11/05/2012 0931   K 3.8 11/05/2012 0931   CL 104 11/05/2012 0931   CO2 24 11/05/2012 0931   BUN 17 11/05/2012 0931   CREATININE 0.6 11/05/2012 0931   CREATININE 0.62 10/02/2010 1507      Component Value Date/Time   CALCIUM 9.5 11/05/2012 0931   ALKPHOS 34 (L) 11/05/2012 0931   AST 13 11/05/2012 0931   ALT 13 11/05/2012 0931   BILITOT 0.4 11/05/2012 0931      ASSESSMENT AND PLAN:  Discussed the following assessment and plan:  No diagnosis found.     I discussed the assessment and treatment plan with the patient. The patient was provided an opportunity to ask questions and all  were answered. The patient agreed with the plan and demonstrated an understanding of the instructions.   The patient was advised to call back or seek an in-person evaluation if the symptoms worsen or if the condition fails to improve as anticipated.  F/u: ***  Signed:  Santiago Bumpers, MD           05/13/2018

## 2018-05-14 ENCOUNTER — Ambulatory Visit: Payer: BLUE CROSS/BLUE SHIELD | Admitting: Family Medicine

## 2018-05-14 ENCOUNTER — Telehealth: Payer: Self-pay

## 2018-05-14 ENCOUNTER — Other Ambulatory Visit: Payer: Self-pay

## 2018-05-14 NOTE — Telephone Encounter (Signed)
LMTCB regarding appt with PCP at 11:20.

## 2018-06-26 ENCOUNTER — Other Ambulatory Visit: Payer: Self-pay | Admitting: Family Medicine

## 2018-12-03 ENCOUNTER — Other Ambulatory Visit: Payer: Self-pay | Admitting: Family Medicine

## 2018-12-04 ENCOUNTER — Other Ambulatory Visit: Payer: Self-pay | Admitting: Family Medicine

## 2018-12-07 ENCOUNTER — Other Ambulatory Visit: Payer: Self-pay

## 2018-12-07 ENCOUNTER — Telehealth: Payer: Self-pay | Admitting: Family Medicine

## 2018-12-07 MED ORDER — VENLAFAXINE HCL ER 75 MG PO CP24: 225.0000 mg | ORAL_CAPSULE | Freq: Every day | ORAL | 0 refills | Status: DC

## 2018-12-07 NOTE — Telephone Encounter (Signed)
Sent in medication for patient to pharmacy

## 2018-12-07 NOTE — Telephone Encounter (Signed)
Patient request refill on medication. She is out of the medicine x 1 day.  Patient scheduled CPE with Dr. Anitra Lauth for 12/3  venlafaxine XR (EFFEXOR-XR) 75 MG 24 hr capsule [[id number]  [facility]

## 2018-12-18 DIAGNOSIS — Z3202 Encounter for pregnancy test, result negative: Secondary | ICD-10-CM | POA: Diagnosis not present

## 2018-12-18 DIAGNOSIS — F17210 Nicotine dependence, cigarettes, uncomplicated: Secondary | ICD-10-CM | POA: Diagnosis not present

## 2018-12-18 DIAGNOSIS — N911 Secondary amenorrhea: Secondary | ICD-10-CM | POA: Diagnosis not present

## 2018-12-18 DIAGNOSIS — N924 Excessive bleeding in the premenopausal period: Secondary | ICD-10-CM | POA: Diagnosis not present

## 2018-12-18 DIAGNOSIS — N926 Irregular menstruation, unspecified: Secondary | ICD-10-CM | POA: Diagnosis not present

## 2018-12-29 DIAGNOSIS — E785 Hyperlipidemia, unspecified: Secondary | ICD-10-CM

## 2018-12-29 HISTORY — DX: Hyperlipidemia, unspecified: E78.5

## 2018-12-31 ENCOUNTER — Encounter: Payer: BLUE CROSS/BLUE SHIELD | Admitting: Family Medicine

## 2018-12-31 ENCOUNTER — Encounter: Payer: Self-pay | Admitting: Family Medicine

## 2018-12-31 ENCOUNTER — Other Ambulatory Visit: Payer: Self-pay

## 2018-12-31 ENCOUNTER — Ambulatory Visit (INDEPENDENT_AMBULATORY_CARE_PROVIDER_SITE_OTHER): Payer: BC Managed Care – PPO | Admitting: Family Medicine

## 2018-12-31 VITALS — BP 113/79 | HR 73 | Temp 98.7°F | Resp 16 | Ht 62.0 in | Wt 119.8 lb

## 2018-12-31 DIAGNOSIS — Z1211 Encounter for screening for malignant neoplasm of colon: Secondary | ICD-10-CM

## 2018-12-31 DIAGNOSIS — Z23 Encounter for immunization: Secondary | ICD-10-CM

## 2018-12-31 DIAGNOSIS — R5382 Chronic fatigue, unspecified: Secondary | ICD-10-CM

## 2018-12-31 DIAGNOSIS — Z0000 Encounter for general adult medical examination without abnormal findings: Secondary | ICD-10-CM

## 2018-12-31 DIAGNOSIS — F411 Generalized anxiety disorder: Secondary | ICD-10-CM | POA: Diagnosis not present

## 2018-12-31 MED ORDER — CLONAZEPAM 1 MG PO TABS: ORAL_TABLET | ORAL | 1 refills | Status: DC

## 2018-12-31 NOTE — Addendum Note (Signed)
Addended by: Deveron Furlong D on: 12/31/2018 03:42 PM   Modules accepted: Orders

## 2018-12-31 NOTE — Progress Notes (Signed)
Office Note 12/31/2018  CC:  Chief Complaint  Patient presents with  . Annual Exam    pt is not fasting   HPI:  Jamie Tucker is a 53 y.o. White female who is here for health maintenance exam. I have been following her for adult ADD and GAD and recurrent MDD.  Most recent f/u visit was 06/10/17.   PMP AWARE reviewed today:  Most recent adderall rx filled 09/02/17, #60, by me. Most recent clonazepam rx filled->04/17/18.  GYN Dr. Loreta AveWagner. Her venlafaxine was continued/RF'd through that office.  She is getting a pelvic u/s and endometrial bx due to some irregular vaginal bleeding. Most recent pap was 2018.   Working at [employer] and is liking it. Has less stress and has put some weight back on. Is feeling stable from anxiety/mood standpoint.  She does not want to get back on adderall. She still deals with increased anxiety episodes.  Near panic at times.  Clonaz has helped significantly in the past.  Does want to continue this medication.  She feels moderate fatigue.  This is not an entirely new complaint for her.  Says it is nothing profound. No exercise. No snoring or witnessed apnea. No melena or abd pain.  No nsaids. No fevers, myalgias, or arthralgias. Sleeps 7 hrs/night avg but not completely restorative, drinks lots of coffee during day and into evenings. No cold intolerance, new constipation, or dry skin probs.    Past Medical History:  Diagnosis Date  . Adult ADHD   . Alcoholism (HCC)    [facility] for acute Alc intox,; [facility]  . Anxiety   . Atypical chest pain 06/2009  . Depression   . History of abnormal Pap smear 11/2006   ASC-US, High risk HPV negative.  Follow up paps normal.  . History of ETT 06/2009   Normal/No ischemia  . History of mammogram    Normal, most recent 09/2007 (through her OB/GYN)  . Low back pain    Pt reports L/S x-ray that showed deg arth in the past  . Tobacco dependence     Past Surgical  History:  Procedure Laterality Date  . BUNIONECTOMY  1997   Left foot  . exercise treadmill test  06/2009   No ischemia  . TRANSTHORACIC ECHOCARDIOGRAM  11/2009   Normal except grade I diastolic dysfunction    Family History  Problem Relation Age of Onset  . Mitral valve prolapse Mother   . Heart disease Mother        MVP  . Heart disease Father        CABG in his 4350's, died age 52 of MI    Social History   Socioeconomic History  . Marital status: Married    Spouse name: Not on file  . Number of children: 2  . Years of education: Not on file  . Highest education level: Not on file  Occupational History    [employer]er: [employer]  Social Needs  . Financial resource strain: Not on file  . Food insecurity    Worry: Not on file    Inability: Not on file  . Transportation needs    Medical: Not on file    Non-medical: Not on file  Tobacco Use  . Smoking status: Current Every Day Smoker    Packs/day: 0.50    Years: 20.00    Pack years: 10.00  . Smokeless tobacco: Never Used  Substance and Sexual Activity  .  Alcohol use: No  . Drug use: No  . Sexual activity: Not on file  Lifestyle  . Physical activity    Days per week: Not on file    Minutes per session: Not on file  . Stress: Not on file  Relationships  . Social Musician on phone: Not on file    Gets together: Not on file    Attends religious service: Not on file    Active member of club or organization: Not on file    Attends meetings of clubs or organizations: Not on file    Relationship status: Not on file  . Intimate partner violence    Fear of current or ex partner: Not on file    Emotionally abused: Not on file    Physically abused: Not on file    Forced sexual activity: Not on file  Other Topics Concern  . Not on file  Social History Narrative   Married, 2 sons (ages 77y and 9y---Aiden and [place]).   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  Unemployed  as of 04/2016.   Originally from [place].   Tobacco: 20+ yrs of 1/2 pack q 2-3 day.   ETOH abuse in past: sober x 3 yrs as of 05/2010.   No illegal drug abuse.    Outpatient Medications Prior to Visit  Medication Sig Dispense Refill  . fexofenadine (ALLEGRA) 180 MG tablet Take 180 mg by mouth daily. itching    . venlafaxine XR (EFFEXOR-XR) 75 MG 24 hr capsule Take 3 capsules (225 mg total) by mouth daily. OFFICE VISIT NEEDED. 270 capsule 0  . amphetamine-dextroamphetamine (ADDERALL) 20 MG tablet 1 tab po tid (Patient not taking: Reported on 12/31/2018) 90 tablet 0  . clonazePAM (KLONOPIN) 1 MG tablet TAKE 1-2 TABS BY MOUTH EVERY 12 HOURS AS NEEDED ANXIETY (Patient not taking: Reported on 12/31/2018) 60 tablet 0   No facility-administered medications prior to visit.     No Known Allergies  ROS Review of Systems  Constitutional: Positive for fatigue (as per hpi). Negative for appetite change, chills and fever.  HENT: Negative for congestion, dental problem, ear pain and sore throat.   Eyes: Negative for discharge, redness and visual disturbance.  Respiratory: Negative for cough, chest tightness, shortness of breath and wheezing.   Cardiovascular: Negative for chest pain, palpitations and leg swelling.  Gastrointestinal: Negative for abdominal pain, blood in stool, diarrhea, nausea and vomiting.  Genitourinary: Negative for difficulty urinating, dysuria, flank pain, frequency, hematuria and urgency.  Musculoskeletal: Negative for arthralgias, back pain, joint swelling, myalgias and neck stiffness.  Skin: Negative for pallor and rash.  Neurological: Negative for dizziness, speech difficulty, weakness and headaches.  Hematological: Negative for adenopathy. Does not bruise/bleed easily.  Psychiatric/Behavioral: Negative for confusion and sleep disturbance. The patient is not nervous/anxious.     PE; Blood pressure 113/79, pulse 73, temperature 98.7 F (37.1 C), temperature source  Temporal, resp. rate 16, height 5\' 2"  (1.575 m), weight 119 lb 12.8 oz (54.3 kg), last menstrual period 12/04/2018, SpO2 94 %. Body mass index is 21.91 kg/m. Exam chaperoned by 13/06/2018, LPN. Gen: Alert, well appearing.  Patient is oriented to person, place, time, and situation. AFFECT: pleasant, lucid thought and speech. ENT: Ears: EACs clear, normal epithelium.  TMs with good light reflex and landmarks bilaterally.  Eyes: no injection, icteris, swelling, or exudate.  EOMI, PERRLA. Nose: no drainage or turbinate edema/swelling.  No injection or focal lesion.  Mouth: lips without lesion/swelling.  Oral mucosa pink and moist.  Dentition intact and without obvious caries or gingival swelling.  Oropharynx without erythema, exudate, or swelling.  Neck: supple/nontender.  No LAD, mass, or TM.  Carotid pulses 2+ bilaterally, without bruits. CV: RRR, no m/r/g.   LUNGS: CTA bilat, nonlabored resps, good aeration in all lung fields. ABD: soft, NT, ND, BS normal.  No hepatospenomegaly or mass.  No bruits. EXT: no clubbing, cyanosis, or edema.  Musculoskeletal: no joint swelling, erythema, warmth, or tenderness.  ROM of all joints intact. Skin - no sores or suspicious lesions or rashes or color changes   Pertinent labs:  Lab Results  Component Value Date   TSH 1.55 11/05/2012   Lab Results  Component Value Date   WBC 10.3 11/05/2012   HGB 12.7 11/05/2012   HCT 38.0 11/05/2012   MCV 90.0 11/05/2012   PLT 310.0 11/05/2012   Lab Results  Component Value Date   CREATININE 0.6 11/05/2012   BUN 17 11/05/2012   NA 136 11/05/2012   K 3.8 11/05/2012   CL 104 11/05/2012   CO2 24 11/05/2012   Lab Results  Component Value Date   ALT 13 11/05/2012   AST 13 11/05/2012   ALKPHOS 34 (L) 11/05/2012   BILITOT 0.4 11/05/2012   No results found for: CHOL No results found for: HDL No results found for: LDLCALC No results found for: TRIG No results found for: CHOLHDL No results found for:  PSA  No results found for: HGBA1C   ASSESSMENT AND PLAN:   1) Chronic fatigue, mild, suspect needs to exercise, discussed options. Health panel labs ordered future when fasting.  2) GAD: restart clonaz 1mg , 1 bid prn, #180, RF x 1. CSC renewed today.  3) Health maintenance exam: Reviewed age and gender appropriate health maintenance issues (prudent diet, regular exercise, health risks of tobacco and excessive alcohol, use of seatbelts, fire alarms in home, use of sunscreen).  Also reviewed age and gender appropriate health screening as well as vaccine recommendations. Vaccines:  Flu->give this today. Tdap->given today.  Shingrix->defers for now. Labs: HP labs ordered-future. Cervical ca screening: last pap/pelvic was 2018, normal ->likely repeat in 1-3 yrs per GYN. Breast ca screening: last mammogram was last year and pt has order for repeat via her GYN MD. Colon ca screening: average risk, pt due for initial screening->refer to GI now.  An After Visit Summary was printed and given to the patient.  FOLLOW UP:  Return in about 6 months (around 07/01/2019) for routine chronic illness f/u.  Signed:  Crissie Sickles, MD           12/31/2018

## 2019-01-06 ENCOUNTER — Ambulatory Visit: Payer: BLUE CROSS/BLUE SHIELD

## 2019-01-12 ENCOUNTER — Other Ambulatory Visit: Payer: Self-pay

## 2019-01-12 ENCOUNTER — Ambulatory Visit (INDEPENDENT_AMBULATORY_CARE_PROVIDER_SITE_OTHER): Payer: Self-pay | Admitting: Family Medicine

## 2019-01-12 DIAGNOSIS — N911 Secondary amenorrhea: Secondary | ICD-10-CM | POA: Diagnosis not present

## 2019-01-12 DIAGNOSIS — N858 Other specified noninflammatory disorders of uterus: Secondary | ICD-10-CM | POA: Diagnosis not present

## 2019-01-12 DIAGNOSIS — D251 Intramural leiomyoma of uterus: Secondary | ICD-10-CM | POA: Diagnosis not present

## 2019-01-12 DIAGNOSIS — Z3202 Encounter for pregnancy test, result negative: Secondary | ICD-10-CM | POA: Diagnosis not present

## 2019-01-12 DIAGNOSIS — N938 Other specified abnormal uterine and vaginal bleeding: Secondary | ICD-10-CM | POA: Diagnosis not present

## 2019-01-12 DIAGNOSIS — R5382 Chronic fatigue, unspecified: Secondary | ICD-10-CM

## 2019-01-12 DIAGNOSIS — Z0000 Encounter for general adult medical examination without abnormal findings: Secondary | ICD-10-CM

## 2019-01-12 DIAGNOSIS — N926 Irregular menstruation, unspecified: Secondary | ICD-10-CM | POA: Diagnosis not present

## 2019-01-12 LAB — CBC WITH DIFFERENTIAL/PLATELET
Basophils Absolute: 0.1 10*3/uL (ref 0.0–0.1)
Basophils Relative: 1 % (ref 0.0–3.0)
Eosinophils Absolute: 0.1 10*3/uL (ref 0.0–0.7)
Eosinophils Relative: 1.3 % (ref 0.0–5.0)
HCT: 42.8 % (ref 36.0–46.0)
Hemoglobin: 13.9 g/dL (ref 12.0–15.0)
Lymphocytes Relative: 22.4 % (ref 12.0–46.0)
Lymphs Abs: 2.3 10*3/uL (ref 0.7–4.0)
MCHC: 32.6 g/dL (ref 30.0–36.0)
MCV: 94.1 fl (ref 78.0–100.0)
Monocytes Absolute: 0.6 10*3/uL (ref 0.1–1.0)
Monocytes Relative: 5.9 % (ref 3.0–12.0)
Neutro Abs: 7.2 10*3/uL (ref 1.4–7.7)
Neutrophils Relative %: 69.4 % (ref 43.0–77.0)
Platelets: 384 10*3/uL (ref 150.0–400.0)
RBC: 4.54 Mil/uL (ref 3.87–5.11)
RDW: 12.9 % (ref 11.5–15.5)
WBC: 10.3 10*3/uL (ref 4.0–10.5)

## 2019-01-12 LAB — LIPID PANEL
Cholesterol: 226 mg/dL — ABNORMAL HIGH (ref 0–200)
HDL: 63.1 mg/dL (ref >39.00)
LDL Cholesterol: 141 mg/dL — ABNORMAL HIGH (ref 0–99)
NonHDL: 162.98
Total CHOL/HDL Ratio: 4
Triglycerides: 108 mg/dL (ref 0.0–149.0)
VLDL: 21.6 mg/dL (ref 0.0–40.0)

## 2019-01-12 LAB — COMPREHENSIVE METABOLIC PANEL
ALT: 11 U/L (ref 0–35)
AST: 14 U/L (ref 0–37)
Albumin: 4.7 g/dL (ref 3.5–5.2)
Alkaline Phosphatase: 45 U/L (ref 39–117)
BUN: 15 mg/dL (ref 6–23)
CO2: 28 mEq/L (ref 19–32)
Calcium: 10.3 mg/dL (ref 8.4–10.5)
Chloride: 102 mEq/L (ref 96–112)
Creatinine, Ser: 0.75 mg/dL (ref 0.40–1.20)
GFR: 81.05 mL/min (ref >60.00)
Glucose, Bld: 85 mg/dL (ref 70–99)
Potassium: 4.8 mEq/L (ref 3.5–5.1)
Sodium: 137 mEq/L (ref 135–145)
Total Bilirubin: 0.4 mg/dL (ref 0.2–1.2)
Total Protein: 7.4 g/dL (ref 6.0–8.3)

## 2019-01-12 LAB — TSH: TSH: 1.73 u[IU]/mL (ref 0.35–4.50)

## 2019-01-13 ENCOUNTER — Encounter: Payer: Self-pay | Admitting: Family Medicine

## 2019-01-15 ENCOUNTER — Ambulatory Visit: Payer: BLUE CROSS/BLUE SHIELD

## 2019-02-19 ENCOUNTER — Ambulatory Visit (INDEPENDENT_AMBULATORY_CARE_PROVIDER_SITE_OTHER): Payer: BC Managed Care – PPO | Admitting: Medical

## 2019-02-19 ENCOUNTER — Other Ambulatory Visit: Payer: Self-pay

## 2019-02-19 VITALS — Temp 101.2°F | Wt 119.0 lb

## 2019-02-19 DIAGNOSIS — R0600 Dyspnea, unspecified: Secondary | ICD-10-CM | POA: Diagnosis not present

## 2019-02-19 DIAGNOSIS — Z20822 Contact with and (suspected) exposure to covid-19: Secondary | ICD-10-CM

## 2019-02-19 DIAGNOSIS — R059 Cough, unspecified: Secondary | ICD-10-CM

## 2019-02-19 DIAGNOSIS — R05 Cough: Secondary | ICD-10-CM

## 2019-02-19 DIAGNOSIS — R509 Fever, unspecified: Secondary | ICD-10-CM | POA: Diagnosis not present

## 2019-02-19 MED ORDER — BENZONATATE 100 MG PO CAPS: 100.0000 mg | ORAL_CAPSULE | Freq: Three times a day (TID) | ORAL | 0 refills | Status: DC | PRN

## 2019-02-19 MED ORDER — AZITHROMYCIN 250 MG PO TABS: ORAL_TABLET | ORAL | 0 refills | Status: DC

## 2019-02-19 MED ORDER — ALBUTEROL SULFATE HFA 108 (90 BASE) MCG/ACT IN AERS: 2.0000 | INHALATION_SPRAY | Freq: Four times a day (QID) | RESPIRATORY_TRACT | 0 refills | Status: DC | PRN

## 2019-02-19 NOTE — Patient Instructions (Signed)
You do have various signs and symptoms that cause concern for possible Covid infection.  Some dyspnea on Wednesday when all your signs/symptoms started.  No dyspnea reported since.  With your fever, cough and approaching weekend, I do want you to go ahead and start a azithromycin antibiotic, benzonatate cough tablets and make albuterol inhaler available to use if needed.  I do want you to go ahead and get tested for Covid.  I gave ED phone number for test center through [facility].  Please get tested today or Monday morning.  If you find a more convenient test center closer to your home then can get done there but please notify us of the results.  Recommend staying at home and quarantining pending test results and pending reassessment of your clinical condition.  Please get O2 sat monitor from the pharmacy and check your oxygen is daily at rest and after short ambulation.  Discussed signs/symptoms as well as O2 sat numbers that would indicate need for ED evaluation.  Follow-up date to be determined pending Covid test results.  Please update [place] by my chart when you get the test results.  I will send you a work excuse note through my chart

## 2019-02-19 NOTE — Progress Notes (Signed)
   Subjective:    Patient ID: Jamie Tucker, female    DOB: [birth year], 54 y.o.   MRN: [id number]  HPI  Virtual Visit via Video Note  I connected with Franki Cabot on 02/19/19 at  1:20 PM EST by a video enabled telemedicine application and verified that I am speaking with the correct person using two identifiers.  Location: Patient: home Provider: office.   I discussed the limitations of evaluation and management by telemedicine and the availability of in person appointments. The patient expressed understanding and agreed to proceed.  History of Present Illness:   Pt states on wed she got diffuse body aches, chills , subjective fever, fatigue and has dry cough. Some nasal congestion.  Signs/symptoms started this Wednesday 2:30 pm.  Pt has no wheezing. But sob on Wednesday around time symptoms started climbing hill  Pt has no family or friend that have been sick.  Pt has normal smell, normal taste and has appetite.   Observations/Objective: General-no acute distress, pleasant, oriented. Lungs- on inspection lungs appear unlabored. Neck- no tracheal deviation or jvd on inspection. Neuro- gross motor function appears intact.  Assessment and Plan: You do have various signs and symptoms that cause concern for possible Covid infection.  Some dyspnea on Wednesday when all your signs/symptoms started.  No dyspnea reported since.  With your fever, cough and approaching weekend, I do want you to go ahead and start a azithromycin antibiotic, benzonatate cough tablets and make albuterol inhaler available to use if needed.  I do want you to go ahead and get tested for Covid.  I gave ED phone number for test center through [facility].  Please get tested today or Monday morning.  If you find a more convenient test center closer to your home then can get done there but please notify us of the results.  Recommend staying at home and quarantining pending test results and pending  reassessment of your clinical condition.  Please get O2 sat monitor from the pharmacy and check your oxygen is daily at rest and after short ambulation.  Discussed signs/symptoms as well as O2 sat numbers that would indicate need for ED evaluation.  Follow-up date to be determined pending Covid test results.  Please update [place] by my chart when you get the test results.  I will send you a work excuse note through my chart  Follow Up Instructions:    I discussed the assessment and treatment plan with the patient. The patient was provided an opportunity to ask questions and all were answered. The patient agreed with the plan and demonstrated an understanding of the instructions.   The patient was advised to call back or seek an in-person evaluation if the symptoms worsen or if the condition fails to improve as anticipated.     Esperanza Richters, PA-C    Review of Systems     Objective:   Physical Exam        Assessment & Plan:

## 2019-02-20 DIAGNOSIS — M791 Myalgia, unspecified site: Secondary | ICD-10-CM | POA: Diagnosis not present

## 2019-02-20 DIAGNOSIS — Z1152 Encounter for screening for COVID-19: Secondary | ICD-10-CM | POA: Diagnosis not present

## 2019-03-01 ENCOUNTER — Other Ambulatory Visit: Payer: Self-pay | Admitting: Family Medicine

## 2019-03-04 ENCOUNTER — Encounter: Payer: Self-pay | Admitting: Family Medicine

## 2019-03-13 ENCOUNTER — Other Ambulatory Visit: Payer: Self-pay | Admitting: Medical

## 2019-03-14 ENCOUNTER — Encounter: Payer: Self-pay | Admitting: Family Medicine

## 2019-05-15 ENCOUNTER — Other Ambulatory Visit: Payer: Self-pay | Admitting: Family Medicine

## 2019-06-18 ENCOUNTER — Other Ambulatory Visit: Payer: Self-pay | Admitting: Family Medicine

## 2019-06-18 NOTE — Telephone Encounter (Signed)
Patient scheduled 6 month f/u 07/15/19.  RX sent.

## 2019-06-18 NOTE — Telephone Encounter (Signed)
Patient due for 6 month f/u in June, should have enough to get her till then but no appt scheduled.  LMOM for pt to CB to schedule appointment and we can send in short term supply at that time if needed.

## 2019-07-15 ENCOUNTER — Telehealth (INDEPENDENT_AMBULATORY_CARE_PROVIDER_SITE_OTHER): Payer: BC Managed Care – PPO | Admitting: Family Medicine

## 2019-07-15 ENCOUNTER — Encounter: Payer: Self-pay | Admitting: Family Medicine

## 2019-07-15 ENCOUNTER — Other Ambulatory Visit: Payer: Self-pay

## 2019-07-15 VITALS — Wt 112.0 lb

## 2019-07-15 DIAGNOSIS — F411 Generalized anxiety disorder: Secondary | ICD-10-CM

## 2019-07-15 DIAGNOSIS — F17200 Nicotine dependence, unspecified, uncomplicated: Secondary | ICD-10-CM | POA: Diagnosis not present

## 2019-07-15 DIAGNOSIS — F3342 Major depressive disorder, recurrent, in full remission: Secondary | ICD-10-CM | POA: Diagnosis not present

## 2019-07-15 MED ORDER — VENLAFAXINE HCL ER 75 MG PO CP24: 225.0000 mg | ORAL_CAPSULE | Freq: Every day | ORAL | 3 refills | Status: DC

## 2019-07-15 MED ORDER — CLONAZEPAM 1 MG PO TABS: ORAL_TABLET | ORAL | 1 refills | Status: DC

## 2019-07-15 NOTE — Progress Notes (Signed)
Virtual Visit via Video Note  I connected with pt on 07/15/19 at  3:30 PM EDT by a video enabled telemedicine application and verified that I am speaking with the correct person using two identifiers.  Location patient: home Location provider:work or home office Persons participating in the virtual visit: patient, provider  I discussed the limitations of evaluation and management by telemedicine and the availability of in person appointments. The patient expressed understanding and agreed to proceed.  Telemedicine visit is a necessity given the COVID-19 restrictions in place at the current time.  HPI: 54 y/o WF being seen today for 6 mo  f/u anx/dep, adult ADD. Has hx of alcoholism and has been dry many years now. +hx of ongoing tobacco dependence and mild hyperlipidemia.  At the time of last visit she was off her stimulant for ADD and did not want to get back on this type of med.  Venlafaxine managed by her GYN. Was on prozac many years ago and she recalls it being helpful. Energy level still not very good.  Going through a separation.  Not really feeling depressed at all.  No exercising. She gets 8 hours of sleep a night.  When she was on adderall she felt like her depression was better but she was feeling like taking these types of meds may become a problem for her given her potential for addiction. She continues to smoke.  She is vaping.  She is afraid of chantix. She wants to continue to try to quit w/out med aid.   She has long been stable taking clonazepam regularly/scheduled for her GAD. Helps the same as it has in past years.  She is perimenopausal and this med helps with her mood swings. PMP AWARE reviewed today: most recent rx for clonazepam 1mg  was filled 04/22/19, # 180, rx by me. No red flags. CSC UTD (12/31/18).   ROS: See pertinent positives and negatives per HPI.  Past Medical History:  Diagnosis Date  . Adult ADHD   . Alcoholism (HCC)    [facility] ICU admission 2007  for acute Alc intox,; also detox at [facility] 11/2005  . Anxiety   . Atypical chest pain 06/2009  . Colon cancer screening    Referred to GI for colonoscopy 2021 but pt never returned GI's calls.  . Depression   . History of abnormal Pap smear 11/2006   ASC-US, High risk HPV negative.  Follow up paps normal.  . History of ETT 06/2009   Normal/No ischemia  . History of mammogram    Normal, most recent 09/2007 (through her OB/GYN)  . Low back pain    Pt reports L/S x-ray that showed deg arth in the past  . Mild hyperlipidemia 12/2018   TLC  . Tobacco dependence     Past Surgical History:  Procedure Laterality Date  . BUNIONECTOMY  1997   Left foot  . exercise treadmill test  06/2009   No ischemia  . TRANSTHORACIC ECHOCARDIOGRAM  11/2009   Normal except grade I diastolic dysfunction    Family History  Problem Relation Age of Onset  . Mitral valve prolapse Mother   . Heart disease Mother        MVP  . Heart disease Father        CABG in his 33's, died age 13 of MI   Social History   Socioeconomic History  . Marital status: Married    Spouse name: Not on file  . Number of children: 2  . Years  of education: Not on file  . Highest education level: Not on file  Occupational History    Employer: [employer]  Tobacco Use  . Smoking status: Current Every Day Smoker    Packs/day: 0.50    Years: 20.00    Pack years: 10.00  . Smokeless tobacco: Never Used  Substance and Sexual Activity  . Alcohol use: No  . Drug use: No  . Sexual activity: Not on file  Other Topics Concern  . Not on file  Social History Narrative   Married, 2 sons (ages 55y and 9y---Aiden and [place]).   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  Unemployed as of 04/2016.   Originally from [place].   Tobacco: 20+ yrs of 1/2 pack q 2-3 day.   ETOH abuse in past: sober x 3 yrs as of 05/2010.   No illegal drug abuse.   Social Determinants of Health   Financial Resource  Strain:   . Difficulty of Paying Living Expenses:   Food Insecurity:   . Worried About Charity fundraiser in the Last Year:   . Arboriculturist in the Last Year:   Transportation Needs:   . Film/video editor (Medical):   Marland Kitchen Lack of Transportation (Non-Medical):   Physical Activity:   . Days of Exercise per Week:   . Minutes of Exercise per Session:   Stress:   . Feeling of Stress :   Social Connections:   . Frequency of Communication with Friends and Family:   . Frequency of Social Gatherings with Friends and Family:   . Attends Religious Services:   . Active Member of Clubs or Organizations:   . Attends Archivist Meetings:   Marland Kitchen Marital Status:       Current Outpatient Medications:  .  clonazePAM (KLONOPIN) 1 MG tablet, TAKE 1 TAB BY MOUTH EVERY 12 HOURS AS NEEDED FOR INCREASED ANXIETY, Disp: 180 tablet, Rfl: 1 .  fexofenadine (ALLEGRA) 180 MG tablet, Take 180 mg by mouth daily. itching, Disp: , Rfl:  .  venlafaxine XR (EFFEXOR-XR) 75 MG 24 hr capsule, TAKE 3 CAPSULES (225 MG TOTAL) BY MOUTH DAILY. OFFICE VISIT NEEDED., Disp: 270 capsule, Rfl: 0  EXAM:  VITALS per patient if applicable: Wt 112 lb (50.8 kg)   BMI 20.49 kg/m    GENERAL: alert, oriented, appears well and in no acute distress  HEENT: atraumatic, conjunttiva clear, no obvious abnormalities on inspection of external nose and ears  NECK: normal movements of the head and neck  LUNGS: on inspection no signs of respiratory distress, breathing rate appears normal, no obvious gross SOB, gasping or wheezing  CV: no obvious cyanosis  MS: moves all visible extremities without noticeable abnormality  PSYCH/NEURO: pleasant and cooperative, no obvious depression or anxiety, speech and thought processing grossly intact  LABS:  None today  Lab Results  Component Value Date   TSH 1.73 01/12/2019   Lab Results  Component Value Date   WBC 10.3 01/12/2019   HGB 13.9 01/12/2019   HCT 42.8 01/12/2019    MCV 94.1 01/12/2019   PLT 384.0 01/12/2019   Lab Results  Component Value Date   CREATININE 0.75 01/12/2019   BUN 15 01/12/2019   NA 137 01/12/2019   K 4.8 01/12/2019   CL 102 01/12/2019   CO2 28 01/12/2019   Lab Results  Component Value Date   ALT 11 01/12/2019   AST 14 01/12/2019   ALKPHOS 45 01/12/2019  BILITOT 0.4 01/12/2019   Lab Results  Component Value Date   CHOL 226 (H) 01/12/2019   Lab Results  Component Value Date   HDL 63.10 01/12/2019   Lab Results  Component Value Date   LDLCALC 141 (H) 01/12/2019   Lab Results  Component Value Date   TRIG 108.0 01/12/2019   Lab Results  Component Value Date   CHOLHDL 4 01/12/2019    ASSESSMENT AND PLAN:  Discussed the following assessment and plan:  1) Stable GAD and MDD. No changes in meds at this time. CSC UTD. Rxs for venlafaxine and clonaz eRx'd--see orders. Appropriate fill on/after dates on rx's. She will continue to try to quit smoking on her own, trying not to resort to it as a coping mechanism. Doing well staying sober (13 yrs now).  I discussed the assessment and treatment plan with the patient. The patient was provided an opportunity to ask questions and all were answered. The patient agreed with the plan and demonstrated an understanding of the instructions.   The patient was advised to call back or seek an in-person evaluation if the symptoms worsen or if the condition fails to improve as anticipated.  F/u: 6 mo CPE  Signed:  Santiago Bumpers, MD           07/15/2019

## 2019-07-29 ENCOUNTER — Ambulatory Visit: Payer: BC Managed Care – PPO | Attending: Internal Medicine

## 2019-08-20 DIAGNOSIS — N3001 Acute cystitis with hematuria: Secondary | ICD-10-CM | POA: Diagnosis not present

## 2019-08-20 DIAGNOSIS — R350 Frequency of micturition: Secondary | ICD-10-CM | POA: Diagnosis not present

## 2019-09-14 ENCOUNTER — Ambulatory Visit: Payer: BC Managed Care – PPO | Attending: Internal Medicine

## 2019-09-14 DIAGNOSIS — Z23 Encounter for immunization: Secondary | ICD-10-CM

## 2019-09-14 NOTE — Progress Notes (Signed)
   Covid-19 Vaccination Clinic  Name:  Mayo Owczarzak    MRN: [id number] DOB: [birth year]  09/14/2019  Ms. Nembhard was observed post Covid-19 immunization for 15 minutes without incident. She was provided with Vaccine Information Sheet and instruction to access the V-Safe system.   Ms. Heaps was instructed to call 911 with any severe reactions post vaccine: Marland Kitchen Difficulty breathing  . Swelling of face and throat  . A fast heartbeat  . A bad rash all over body  . Dizziness and weakness   Immunizations Administered    Name Date Dose VIS Date Route   Pfizer COVID-19 Vaccine 09/14/2019 12:49 PM 0.3 mL 03/24/2018 Intramuscular   Manufacturer: [employer], [employer]   Lot: [id number]   NDC: [address]-4

## 2019-10-05 ENCOUNTER — Ambulatory Visit: Payer: Self-pay

## 2019-10-05 ENCOUNTER — Ambulatory Visit: Payer: BC Managed Care – PPO | Attending: Internal Medicine

## 2019-10-05 DIAGNOSIS — Z23 Encounter for immunization: Secondary | ICD-10-CM

## 2019-10-05 NOTE — Progress Notes (Signed)
   Covid-19 Vaccination Clinic  Name:  Jamie Tucker    MRN: [id number] DOB: [birth year]  10/05/2019  Jamie Tucker was observed post Covid-19 immunization for 15 minutes without incident. She was provided with Vaccine Information Sheet and instruction to access the V-Safe system.   Jamie Tucker was instructed to call 911 with any severe reactions post vaccine: Marland Kitchen Difficulty breathing  . Swelling of face and throat  . A fast heartbeat  . A bad rash all over body  . Dizziness and weakness   Immunizations Administered    Name Date Dose VIS Date Route   Pfizer COVID-19 Vaccine 10/05/2019  1:28 PM 0.3 mL 03/24/2018 Intramuscular   Manufacturer: [employer], [employer]   Lot: [id number]   NDC: [id number]

## 2019-10-25 ENCOUNTER — Other Ambulatory Visit: Payer: Self-pay | Admitting: Family Medicine

## 2019-10-25 NOTE — Telephone Encounter (Signed)
  LAST APPOINTMENT DATE: 06/18/2019   NEXT APPOINTMENT DATE:@Visit  date not found  MEDICATION:clonazePAM (KLONOPIN) 1 MG tablet  [id number] [place], [place] - [address]  Comments: patient states that she accidentally [facility], and so now [facility] states that they are unable to refill it unless Dr.Mcgowen sends a new rx in.

## 2019-10-25 NOTE — Telephone Encounter (Signed)
Requesting: clonazepam 1 mg Contract:12/31/18 UDS:n/a Last Visit:07/15/19 Next Visit:n/a Last Refill:07/15/19 (180,1)  Please Advise, also see previous telephone note

## 2019-10-29 MED ORDER — CLONAZEPAM 1 MG PO TABS: ORAL_TABLET | ORAL | 0 refills | Status: DC

## 2019-10-29 NOTE — Telephone Encounter (Signed)
Patient states the [facility]epam is stuck at the  [facility]. She apologized for trying to transfer the Rx. She didn't realize that she couldn't do that. Now she is back home & wants to [facility] the [facility] at [facility]. She is out of medication.

## 2019-10-29 NOTE — Addendum Note (Signed)
Addended by: Jeoffrey Massed on: 10/29/2019 09:39 AM   Modules accepted: Orders

## 2019-10-29 NOTE — Telephone Encounter (Signed)
[facility] and cannot transfer Rx without our office contacting them.

## 2019-10-29 NOTE — Telephone Encounter (Signed)
[facility] [address]? Medication pending

## 2019-10-29 NOTE — Telephone Encounter (Signed)
I'll rx 90 d supply but she needs f/u appt before the end of November ---this would be 6 mo from the last f/u.-thx

## 2019-10-29 NOTE — Telephone Encounter (Signed)
Spoke with patient and advised refill sent but she would be due for next follow up by the end of November. Appt scheduled

## 2019-10-29 NOTE — Addendum Note (Signed)
Addended by: Emi Holes D on: 10/29/2019 01:25 PM   Modules accepted: Orders

## 2019-10-29 NOTE — Addendum Note (Signed)
Addended by: Emi Holes D on: 10/29/2019 09:01 AM   Modules accepted: Orders

## 2019-10-29 NOTE — Telephone Encounter (Signed)
Patient advised refill [facility] to [facility] [address].

## 2019-10-29 NOTE — Telephone Encounter (Signed)
Patient called back. [facility] [address] [facility] [place] [id number]

## 2019-12-03 ENCOUNTER — Ambulatory Visit: Payer: BC Managed Care – PPO | Admitting: Family Medicine

## 2019-12-04 LAB — HM MAMMOGRAPHY

## 2019-12-10 ENCOUNTER — Ambulatory Visit: Payer: Self-pay | Admitting: Family Medicine

## 2019-12-10 ENCOUNTER — Ambulatory Visit: Payer: BC Managed Care – PPO | Admitting: Family Medicine

## 2019-12-19 ENCOUNTER — Encounter: Payer: Self-pay | Admitting: Family Medicine

## 2019-12-20 ENCOUNTER — Ambulatory Visit: Payer: Self-pay | Admitting: Family Medicine

## 2019-12-20 DIAGNOSIS — Z0289 Encounter for other administrative examinations: Secondary | ICD-10-CM

## 2019-12-20 NOTE — Progress Notes (Deleted)
OFFICE VISIT  12/20/2019  CC: No chief complaint on file.   HPI:    Patient is a 54 y.o. Caucasian female who presents for 6 mo f/u anx/dep. Has hx of ongoing tobacco abuse/dependence + mild hyperlipidemia (no meds recommended as of 12/2018). A/P as of last visit: "1) Stable GAD and MDD. No changes in meds at this time. CSC UTD. Rxs for venlafaxine and clonaz eRx'd--see orders. Appropriate fill on/after dates on rx's. She will continue to try to quit smoking on her own, trying not to resort to it as a coping mechanism. Doing well staying sober (13 yrs now)."  INTERIM HX: ***   She has long been stable taking clonazepam regularly/scheduled for her GAD. Helps the same as it has in past years.  She is perimenopausal and this med helps with her mood swings. PMP AWARE reviewed today: most recent rx for *** was filled ***, # ***, rx by ***. No red flags.   Past Medical History:  Diagnosis Date  . Abnormal mammogram of right breast    12/04/19->calcifications->further imaging to be done.  . Adult ADHD   . Alcoholism (HCC)    [facility] ICU admission 2007 for acute Alc intox,; also detox at [facility] 11/2005  . Anxiety   . Atypical chest pain 06/2009  . Colon cancer screening    Referred to GI for colonoscopy 2021 but pt never returned GI's calls.  . Depression   . History of abnormal Pap smear 11/2006   ASC-US, High risk HPV negative.  Follow up paps normal.  . History of ETT 06/2009   Normal/No ischemia  . Low back pain    Pt reports L/S x-ray that showed deg arth in the past  . Mild hyperlipidemia 12/2018   TLC  . Tobacco dependence     Past Surgical History:  Procedure Laterality Date  . BUNIONECTOMY  1997   Left foot  . exercise treadmill test  06/2009   No ischemia  . TRANSTHORACIC ECHOCARDIOGRAM  11/2009   Normal except grade I diastolic dysfunction    Outpatient Medications Prior to Visit  Medication Sig Dispense Refill  . clonazePAM (KLONOPIN) 1 MG tablet TAKE 1 TAB  BY MOUTH EVERY 12 HOURS AS NEEDED FOR INCREASED ANXIETY 180 tablet 0  . fexofenadine (ALLEGRA) 180 MG tablet Take 180 mg by mouth daily. itching    . venlafaxine XR (EFFEXOR-XR) 75 MG 24 hr capsule Take 3 capsules (225 mg total) by mouth daily. Fill upon patient request 270 capsule 3   No facility-administered medications prior to visit.    No Known Allergies  ROS As per HPI  PE: Vitals with BMI 07/15/2019 02/19/2019 12/31/2018  Height - - 5\' 2"   Weight 112 lbs 119 lbs 119 lbs 13 oz  BMI - - 21.91  Systolic - - 113  Diastolic - - 79  Pulse - - 73     ***  LABS:  Lab Results  Component Value Date   TSH 1.73 01/12/2019   Lab Results  Component Value Date   WBC 10.3 01/12/2019   HGB 13.9 01/12/2019   HCT 42.8 01/12/2019   MCV 94.1 01/12/2019   PLT 384.0 01/12/2019   Lab Results  Component Value Date   CREATININE 0.75 01/12/2019   BUN 15 01/12/2019   NA 137 01/12/2019   K 4.8 01/12/2019   CL 102 01/12/2019   CO2 28 01/12/2019   Lab Results  Component Value Date   ALT 11 01/12/2019   AST  14 01/12/2019   ALKPHOS 45 01/12/2019   BILITOT 0.4 01/12/2019   Lab Results  Component Value Date   CHOL 226 (H) 01/12/2019   Lab Results  Component Value Date   HDL 63.10 01/12/2019   Lab Results  Component Value Date   LDLCALC 141 (H) 01/12/2019   Lab Results  Component Value Date   TRIG 108.0 01/12/2019   Lab Results  Component Value Date   CHOLHDL 4 01/12/2019    IMPRESSION AND PLAN:  No problem-specific Assessment & Plan notes found for this encounter.  Vaccines: Flu->***.  Shingrix->***. Cerv ca scr->***. Breast ca screening: recent abnl mammogram->***. Colon ca screening: referred in the past but pt has not set up.  ***  An After Visit Summary was printed and given to the patient.  FOLLOW UP: No follow-ups on file.  Signed:  Santiago Bumpers, MD           12/20/2019

## 2019-12-24 ENCOUNTER — Telehealth: Payer: Self-pay

## 2019-12-24 NOTE — Telephone Encounter (Signed)
Received Mammogram results from [facility] on 12/24/19. Placed on PCP desk for review.

## 2019-12-27 ENCOUNTER — Encounter: Payer: Self-pay | Admitting: Family Medicine

## 2020-01-13 ENCOUNTER — Ambulatory Visit: Payer: 59 | Admitting: Family Medicine

## 2020-01-13 ENCOUNTER — Other Ambulatory Visit: Payer: Self-pay

## 2020-01-13 ENCOUNTER — Encounter: Payer: Self-pay | Admitting: Family Medicine

## 2020-01-13 VITALS — BP 135/90 | HR 77 | Temp 97.9°F | Ht 62.0 in | Wt 105.0 lb

## 2020-01-13 DIAGNOSIS — F411 Generalized anxiety disorder: Secondary | ICD-10-CM

## 2020-01-13 DIAGNOSIS — Z79899 Other long term (current) drug therapy: Secondary | ICD-10-CM

## 2020-01-13 DIAGNOSIS — Z8659 Personal history of other mental and behavioral disorders: Secondary | ICD-10-CM | POA: Diagnosis not present

## 2020-01-13 DIAGNOSIS — Z23 Encounter for immunization: Secondary | ICD-10-CM | POA: Diagnosis not present

## 2020-01-13 MED ORDER — CLONAZEPAM 1 MG PO TABS: ORAL_TABLET | ORAL | 1 refills | Status: DC

## 2020-01-13 NOTE — Progress Notes (Signed)
OFFICE VISIT  01/13/2020  CC:  Chief Complaint  Jamie Tucker presents with  . Follow-up    RCI    HPI:    Jamie Tucker is a 54 y.o. Caucasian female who presents for 6 mo  f/u anx/dep, adult ADD. A/P as of last visit: "1) Stable GAD and MDD. No changes in meds at this time. CSC UTD. Rxs for venlafaxine and clonaz eRx'd--see orders. Appropriate fill on/after dates on rx's. She will continue to try to quit smoking on her own, trying not to resort to it as a coping mechanism. Doing well staying sober (13 yrs now). No stimulant for ADD per pt preference."  INTERIM HX: Doing well. Says not depressed, says anxiety well controlled taking clonaz 1mg  bid.  Taking effexor xr THREE of the 75mg  tabs qd. Has gotten through the rough part of divorce.  Kids doing well--one in HS and 1 in college. Vaping but no cigs. Taking part in AA regularly. Wt is down b/c not eating well due to being very busy and stressed with work--got a promotion, works in of . Appetite is good. No exercise at this time.   PMP AWARE reviewed today: most recent rx for clonazepam 1mg  was filled 10/29/19, # 180, rx by me. No red flags.  ROS: no fevers, no CP, no SOB, no wheezing, no cough, no dizziness, no HAs, no rashes, no melena/hematochezia.  No polyuria or polydipsia.  No myalgias or arthralgias.  No focal weakness, paresthesias, or tremors.  No acute vision or hearing abnormalities. No n/v/d or abd pain.  No palpitations.    Past Medical History:  Diagnosis Date  . Abnormal mammogram of right breast    12/04/19->calcifications->f/u imaging "probably benign, rpt R br diag mammo 6 mo".  . Adult ADHD   . Alcoholism (HCC)    [facility] ICU admission 2007 for acute Alc intox,; also detox at [facility] 11/2005  . Anxiety   . Atypical chest pain 06/2009  . Colon cancer screening    Referred to GI for colonoscopy 2021 but pt never returned GI's calls.  . Depression   . History of abnormal Pap  smear 11/2006   ASC-US, High risk HPV negative.  Follow up paps normal.  . History of ETT 06/2009   Normal/No ischemia  . Low back pain    Pt reports L/S x-ray that showed deg arth in the past  . Mild hyperlipidemia 12/2018   TLC  . Tobacco dependence     Past Surgical History:  Procedure Laterality Date  . BUNIONECTOMY  1997   Left foot  . exercise treadmill test  06/2009   No ischemia  . TRANSTHORACIC ECHOCARDIOGRAM  11/2009   Normal except grade I diastolic dysfunction    Outpatient Medications Prior to Visit  Medication Sig Dispense Refill  . fexofenadine (ALLEGRA) 180 MG tablet Take 180 mg by mouth daily. itching    . venlafaxine XR (EFFEXOR-XR) 75 MG 24 hr capsule Take 3 capsules (225 mg total) by mouth daily. Fill upon Jamie Tucker request 270 capsule 3  . clonazePAM (KLONOPIN) 1 MG tablet TAKE 1 TAB BY MOUTH EVERY 12 HOURS AS NEEDED FOR INCREASED ANXIETY 180 tablet 0   No facility-administered medications prior to visit.    No Known Allergies  ROS As per HPI  PE: Vitals with BMI 01/13/2020 07/15/2019 02/19/2019  Height 5\' 2"  - -  Weight 105 lbs 112 lbs 119 lbs  BMI 19.2 - -  Systolic 135 - -  Diastolic  90 - -  Pulse 77 - -     Gen: Alert, well appearing.  Jamie Tucker is oriented to person, place, time, and situation. AFFECT: pleasant, lucid thought and speech. Neck - No masses or thyromegaly or limitation in range of motion CV: RRR, no m/r/g.   LUNGS: CTA bilat, nonlabored resps, good aeration in all lung fields. EXT: no clubbing or cyanosis.  no edema.    LABS:  Lab Results  Component Value Date   TSH 1.73 01/12/2019   Lab Results  Component Value Date   WBC 10.3 01/12/2019   HGB 13.9 01/12/2019   HCT 42.8 01/12/2019   MCV 94.1 01/12/2019   PLT 384.0 01/12/2019   Lab Results  Component Value Date   CREATININE 0.75 01/12/2019   BUN 15 01/12/2019   NA 137 01/12/2019   K 4.8 01/12/2019   CL 102 01/12/2019   CO2 28 01/12/2019   Lab Results   Component Value Date   ALT 11 01/12/2019   AST 14 01/12/2019   ALKPHOS 45 01/12/2019   BILITOT 0.4 01/12/2019   Lab Results  Component Value Date   CHOL 226 (H) 01/12/2019   Lab Results  Component Value Date   HDL 63.10 01/12/2019   Lab Results  Component Value Date   LDLCALC 141 (H) 01/12/2019   Lab Results  Component Value Date   TRIG 108.0 01/12/2019   Lab Results  Component Value Date   CHOLHDL 4 01/12/2019    IMPRESSION AND PLAN:  1) GAD, hx of MDD: doing well. Cont effexor xr 75mg , 3 tabs qd---no new rx needed today. Also cont clonaz 1mg  bid.  Sent in new rx for #180 with approp fill on/after date. CSC updated. UDS today.  Colon ca screening: on hold b/c insurance issues/copay, but pt still keeping this on her to-do list. Mammogram is UTD (normal 11/2019); rpt 11/2020.  An After Visit Summary was printed and given to the Jamie Tucker.  FOLLOW UP: Return in about 6 months (around 07/13/2020) for annual CPE (fasting).  Signed:  12/2020, MD           01/13/2020

## 2020-01-17 LAB — DRUG MONITORING, PANEL 8 WITH CONFIRMATION, URINE
6 Acetylmorphine: NEGATIVE ng/mL (ref <10)
Alcohol Metabolites: NEGATIVE ng/mL
Alphahydroxyalprazolam: NEGATIVE ng/mL (ref <25)
Alphahydroxymidazolam: NEGATIVE ng/mL (ref <50)
Alphahydroxytriazolam: NEGATIVE ng/mL (ref <50)
Aminoclonazepam: 320 ng/mL — ABNORMAL HIGH (ref <25)
Amphetamines: NEGATIVE ng/mL (ref <500)
Benzodiazepines: POSITIVE ng/mL — AB (ref <100)
Buprenorphine, Urine: NEGATIVE ng/mL (ref <5)
Cocaine Metabolite: NEGATIVE ng/mL (ref <150)
Creatinine: 81.4 mg/dL
Hydroxyethylflurazepam: NEGATIVE ng/mL (ref <50)
Lorazepam: NEGATIVE ng/mL (ref <50)
MDMA: NEGATIVE ng/mL (ref <500)
Marijuana Metabolite: NEGATIVE ng/mL (ref <20)
Nordiazepam: NEGATIVE ng/mL (ref <50)
Opiates: NEGATIVE ng/mL (ref <100)
Oxazepam: NEGATIVE ng/mL (ref <50)
Oxidant: NEGATIVE ug/mL
Oxycodone: NEGATIVE ng/mL (ref <100)
Temazepam: NEGATIVE ng/mL (ref <50)
pH: 5.8 (ref 4.5–9.0)

## 2020-01-17 LAB — DM TEMPLATE

## 2020-07-11 ENCOUNTER — Encounter: Payer: 59 | Admitting: Family Medicine

## 2020-07-20 ENCOUNTER — Other Ambulatory Visit: Payer: Self-pay

## 2020-07-21 ENCOUNTER — Encounter: Payer: Self-pay | Admitting: Family Medicine

## 2020-07-21 ENCOUNTER — Ambulatory Visit (INDEPENDENT_AMBULATORY_CARE_PROVIDER_SITE_OTHER): Payer: 59 | Admitting: Family Medicine

## 2020-07-21 VITALS — BP 101/68 | HR 69 | Temp 97.6°F | Resp 16 | Ht 62.25 in | Wt 111.2 lb

## 2020-07-21 DIAGNOSIS — Z23 Encounter for immunization: Secondary | ICD-10-CM

## 2020-07-21 DIAGNOSIS — Z0000 Encounter for general adult medical examination without abnormal findings: Secondary | ICD-10-CM | POA: Diagnosis not present

## 2020-07-21 DIAGNOSIS — J309 Allergic rhinitis, unspecified: Secondary | ICD-10-CM

## 2020-07-21 DIAGNOSIS — F411 Generalized anxiety disorder: Secondary | ICD-10-CM

## 2020-07-21 DIAGNOSIS — F3342 Major depressive disorder, recurrent, in full remission: Secondary | ICD-10-CM | POA: Diagnosis not present

## 2020-07-21 DIAGNOSIS — J3489 Other specified disorders of nose and nasal sinuses: Secondary | ICD-10-CM | POA: Diagnosis not present

## 2020-07-21 DIAGNOSIS — Z1211 Encounter for screening for malignant neoplasm of colon: Secondary | ICD-10-CM | POA: Diagnosis not present

## 2020-07-21 LAB — LIPID PANEL
Cholesterol: 197 mg/dL (ref 0–200)
HDL: 72.1 mg/dL (ref >39.00)
LDL Cholesterol: 116 mg/dL — ABNORMAL HIGH (ref 0–99)
NonHDL: 124.97
Total CHOL/HDL Ratio: 3
Triglycerides: 44 mg/dL (ref 0.0–149.0)
VLDL: 8.8 mg/dL (ref 0.0–40.0)

## 2020-07-21 LAB — COMPREHENSIVE METABOLIC PANEL
ALT: 9 U/L (ref 0–35)
AST: 12 U/L (ref 0–37)
Albumin: 4.4 g/dL (ref 3.5–5.2)
Alkaline Phosphatase: 38 U/L — ABNORMAL LOW (ref 39–117)
BUN: 15 mg/dL (ref 6–23)
CO2: 25 mEq/L (ref 19–32)
Calcium: 9.7 mg/dL (ref 8.4–10.5)
Chloride: 105 mEq/L (ref 96–112)
Creatinine, Ser: 0.7 mg/dL (ref 0.40–1.20)
GFR: 98.45 mL/min (ref >60.00)
Glucose, Bld: 82 mg/dL (ref 70–99)
Potassium: 4.3 mEq/L (ref 3.5–5.1)
Sodium: 138 mEq/L (ref 135–145)
Total Bilirubin: 0.3 mg/dL (ref 0.2–1.2)
Total Protein: 6.7 g/dL (ref 6.0–8.3)

## 2020-07-21 LAB — CBC WITH DIFFERENTIAL/PLATELET
Basophils Absolute: 0.1 10*3/uL (ref 0.0–0.1)
Basophils Relative: 1 % (ref 0.0–3.0)
Eosinophils Absolute: 0.3 10*3/uL (ref 0.0–0.7)
Eosinophils Relative: 5.7 % — ABNORMAL HIGH (ref 0.0–5.0)
HCT: 36.5 % (ref 36.0–46.0)
Hemoglobin: 12.2 g/dL (ref 12.0–15.0)
Lymphocytes Relative: 35.3 % (ref 12.0–46.0)
Lymphs Abs: 2.1 10*3/uL (ref 0.7–4.0)
MCHC: 33.4 g/dL (ref 30.0–36.0)
MCV: 90.7 fl (ref 78.0–100.0)
Monocytes Absolute: 0.6 10*3/uL (ref 0.1–1.0)
Monocytes Relative: 9.2 % (ref 3.0–12.0)
Neutro Abs: 2.9 10*3/uL (ref 1.4–7.7)
Neutrophils Relative %: 48.8 % (ref 43.0–77.0)
Platelets: 318 10*3/uL (ref 150.0–400.0)
RBC: 4.02 Mil/uL (ref 3.87–5.11)
RDW: 13 % (ref 11.5–15.5)
WBC: 6 10*3/uL (ref 4.0–10.5)

## 2020-07-21 LAB — TSH: TSH: 1.99 u[IU]/mL (ref 0.35–4.50)

## 2020-07-21 MED ORDER — CLONAZEPAM 1 MG PO TABS: ORAL_TABLET | ORAL | 1 refills | Status: DC

## 2020-07-21 NOTE — Patient Instructions (Signed)
Health Maintenance, Female Adopting a healthy lifestyle and getting preventive care are important in promoting health and wellness. Ask your health care provider about: The right schedule for you to have regular tests and exams. Things you can do on your own to prevent diseases and keep yourself healthy. What should I know about diet, weight, and exercise? Eat a healthy diet  Eat a diet that includes plenty of vegetables, fruits, low-fat dairy products, and lean protein. Do not eat a lot of foods that are high in solid fats, added sugars, or sodium.  Maintain a healthy weight Body mass index (BMI) is used to identify weight problems. It estimates body fat based on height and weight. Your health care provider can help determineyour BMI and help you achieve or maintain a healthy weight. Get regular exercise Get regular exercise. This is one of the most important things you can do for your health. Most adults should: Exercise for at least 150 minutes each week. The exercise should increase your heart rate and make you sweat (moderate-intensity exercise). Do strengthening exercises at least twice a week. This is in addition to the moderate-intensity exercise. Spend less time sitting. Even light physical activity can be beneficial. Watch cholesterol and blood lipids Have your blood tested for lipids and cholesterol at 54 years of age, then havethis test every 5 years. Have your cholesterol levels checked more often if: Your lipid or cholesterol levels are high. You are older than 54 years of age. You are at high risk for heart disease. What should I know about cancer screening? Depending on your health history and family history, you may need to have cancer screening at various ages. This may include screening for: Breast cancer. Cervical cancer. Colorectal cancer. Skin cancer. Lung cancer. What should I know about heart disease, diabetes, and high blood pressure? Blood pressure and heart  disease High blood pressure causes heart disease and increases the risk of stroke. This is more likely to develop in people who have high blood pressure readings, are of African descent, or are overweight. Have your blood pressure checked: Every 3-5 years if you are 18-39 years of age. Every year if you are 40 years old or older. Diabetes Have regular diabetes screenings. This checks your fasting blood sugar level. Have the screening done: Once every three years after age 40 if you are at a normal weight and have a low risk for diabetes. More often and at a younger age if you are overweight or have a high risk for diabetes. What should I know about preventing infection? Hepatitis B If you have a higher risk for hepatitis B, you should be screened for this virus. Talk with your health care provider to find out if you are at risk forhepatitis B infection. Hepatitis C Testing is recommended for: Everyone born from 1945 through 1965. Anyone with known risk factors for hepatitis C. Sexually transmitted infections (STIs) Get screened for STIs, including gonorrhea and chlamydia, if: You are sexually active and are younger than 54 years of age. You are older than 54 years of age and your health care provider tells you that you are at risk for this type of infection. Your sexual activity has changed since you were last screened, and you are at increased risk for chlamydia or gonorrhea. Ask your health care provider if you are at risk. Ask your health care provider about whether you are at high risk for HIV. Your health care provider may recommend a prescription medicine to help   prevent HIV infection. If you choose to take medicine to prevent HIV, you should first get tested for HIV. You should then be tested every 3 months for as long as you are taking the medicine. Pregnancy If you are about to stop having your period (premenopausal) and you may become pregnant, seek counseling before you get  pregnant. Take 400 to 800 micrograms (mcg) of folic acid every day if you become pregnant. Ask for birth control (contraception) if you want to prevent pregnancy. Osteoporosis and menopause Osteoporosis is a disease in which the bones lose minerals and strength with aging. This can result in bone fractures. If you are 65 years old or older, or if you are at risk for osteoporosis and fractures, ask your health care provider if you should: Be screened for bone loss. Take a calcium or vitamin D supplement to lower your risk of fractures. Be given hormone replacement therapy (HRT) to treat symptoms of menopause. Follow these instructions at home: Lifestyle Do not use any products that contain nicotine or tobacco, such as cigarettes, e-cigarettes, and chewing tobacco. If you need help quitting, ask your health care provider. Do not use street drugs. Do not share needles. Ask your health care provider for help if you need support or information about quitting drugs. Alcohol use Do not drink alcohol if: Your health care provider tells you not to drink. You are pregnant, may be pregnant, or are planning to become pregnant. If you drink alcohol: Limit how much you use to 0-1 drink a day. Limit intake if you are breastfeeding. Be aware of how much alcohol is in your drink. In the [place] one drink equals one 12 oz bottle of beer (355 mL), one 5 oz glass of wine (148 mL), or one 1 oz glass of hard liquor (44 mL). General instructions Schedule regular health, dental, and eye exams. Stay current with your vaccines. Tell your health care provider if: You often feel depressed. You have ever been abused or do not feel safe at home. Summary Adopting a healthy lifestyle and getting preventive care are important in promoting health and wellness. Follow your health care provider's instructions about healthy diet, exercising, and getting tested or screened for diseases. Follow your health care provider's  instructions on monitoring your cholesterol and blood pressure. This information is not intended to replace advice given to you by your health care provider. Make sure you discuss any questions you have with your healthcare provider. Document Revised: 01/07/2018 Document Reviewed: 01/07/2018 Elsevier Patient Education  2022 [employer]  

## 2020-07-21 NOTE — Progress Notes (Signed)
Office Note 07/21/2020  CC:  Chief Complaint  Patient presents with   Annual Exam    Pt is not fasting   HPI:  Jamie Tucker is a 55 y.o. White female who is here for annual health maintenance exam and 6 mo f/u GAD (with high risk med use) and hx of MDD. A/P as of last visit: "1) GAD, hx of MDD: doing well. Cont effexor xr 75mg , 3 tabs qd---no new rx needed today. Also cont clonaz 1mg  bid.  Sent in new rx for #180 with approp fill on/after date. CSC updated. UDS today. Colon ca screening: on hold b/c insurance issues/copay, but pt still keeping this on her to-do list. 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 Feeling well, appetite good, has gained some wt! Working for 12/2019 still, 12/2020.  Lots of nasal allergy sx's: congestion on/off, sneezing, has to sniffle a lot. No cough or wheezing, no face pain or sinus pressure.  She does have intermittent nasal passage pain/soreness and slight blood occ on tissue.  Uses flonase otc qd.  Not using saline nasal spray.  Anx/mood:  UDS with approp results 01/13/20. Feeling well, no signif problems, no adverse effects from meds. She successfully quit smoking cigs last fall but now vapes.  PMP AWARE reviewed today: most recent rx for clonazepam was filled 04/24/20, # 180, rx by me. No red flags.   Past Medical History:  Diagnosis Date   Abnormal mammogram of right breast    12/04/19->calcifications->f/u imaging "probably benign, rpt R br diag mammo 6 mo".   Adult ADHD    Alcoholism (HCC)    [facility] ICU admission 2007 for acute Alc intox,; also detox at [facility] 11/2005   Anxiety    Atypical chest pain 06/2009   Colon cancer screening    Referred to GI for colonoscopy 2021 but pt never returned GI's calls.   Depression    History of abnormal Pap smear 11/2006   ASC-US, High risk HPV negative.  Follow up paps normal.   History of ETT 06/2009   Normal/No ischemia   Low  back pain    Pt reports L/S x-ray that showed deg arth in the past   Mild hyperlipidemia 12/2018   TLC   Tobacco dependence     Past Surgical History:  Procedure Laterality Date   BUNIONECTOMY  1997   Left foot   exercise treadmill test  06/2009   No ischemia   TRANSTHORACIC ECHOCARDIOGRAM  11/2009   Normal except grade I diastolic dysfunction    Family History  Problem Relation Age of Onset   Mitral valve prolapse Mother    Heart disease Mother        MVP   Heart disease Father        CABG in his 55's, died age 56 of MI    Social History   Socioeconomic History   Marital status: Married    Spouse name: Not on file   Number of children: 2   Years of education: Not on file   Highest education level: Not on file  Occupational History    Employer: [employer]  Tobacco Use   Smoking status: Every Day    Packs/day: 0.50    Years: 20.00    Pack years: 10.00    Types: Cigarettes   Smokeless tobacco: Never  Substance and Sexual Activity   Alcohol use: No   Drug use: No  Sexual activity: Not on file  Other Topics Concern   Not on file  Social History Narrative   Married, 2 sons (ages 83y and 9y---[place] and [place]).   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  Unemployed as of 04/2016.   Originally from [place].   Tobacco: 20+ yrs of 1/2 pack q 2-3 day.   ETOH abuse in past: sober x 3 yrs as of 05/2010.   No illegal drug abuse.   Social Determinants of Health   Financial Resource Strain: Not on file  Food Insecurity: Not on file  Transportation Needs: Not on file  Physical Activity: Not on file  Stress: Not on file  Social Connections: Not on file  Intimate Partner Violence: Not on file    Outpatient Medications Prior to Visit  Medication Sig Dispense Refill   fexofenadine (ALLEGRA) 180 MG tablet Take 180 mg by mouth daily. itching     fluticasone (FLONASE ALLERGY RELIEF) 50 MCG/ACT nasal spray Place 2 sprays into  both nostrils daily.     venlafaxine XR (EFFEXOR-XR) 75 MG 24 hr capsule Take 3 capsules (225 mg total) by mouth daily. Fill upon patient request 270 capsule 3   clonazePAM (KLONOPIN) 1 MG tablet TAKE 1 TAB BY MOUTH EVERY 12 HOURS AS NEEDED FOR INCREASED ANXIETY 180 tablet 1   No facility-administered medications prior to visit.    No Known Allergies  ROS; Review of Systems  Constitutional:  Negative for appetite change, chills, fatigue and fever.  HENT:  Positive for congestion (nasal). Negative for dental problem, ear pain and sore throat.   Eyes:  Negative for discharge, redness and visual disturbance.  Respiratory:  Negative for cough, chest tightness, shortness of breath and wheezing.   Cardiovascular:  Negative for chest pain, palpitations and leg swelling.  Gastrointestinal:  Negative for abdominal pain, blood in stool, diarrhea, nausea and vomiting.  Genitourinary:  Negative for difficulty urinating, dysuria, flank pain, frequency, hematuria and urgency.  Musculoskeletal:  Negative for arthralgias, back pain, joint swelling, myalgias and neck stiffness.  Skin:  Negative for pallor and rash.  Neurological:  Negative for dizziness, speech difficulty, weakness and headaches.  Hematological:  Negative for adenopathy. Does not bruise/bleed easily.  Psychiatric/Behavioral:  Negative for confusion and sleep disturbance. The patient is not nervous/anxious.    PE; Vitals with BMI 07/21/2020 01/13/2020 07/15/2019  Height 5' 2.25" 5\' 2"  -  Weight 111 lbs 3 oz 105 lbs 112 lbs  BMI 20.18 19.2 -  Systolic 101 135 -  Diastolic 68 90 -  Pulse 69 77 -   Gen: Alert, well appearing.  Patient is oriented to person, place, time, and situation. AFFECT: pleasant, lucid thought and speech. ENT: Ears: EACs clear, normal epithelium.  TMs with good light reflex and landmarks bilaterally.  Eyes: no injection, icteris, swelling, or exudate.  EOMI, PERRLA. Nose: diffuse mucosal erythema w/out purulent  exudate.  Nasal septum perforation noted. Small scab in L nostril. Oral mucosa pink and moist.  Dentition intact and without obvious caries or gingival swelling.  Oropharynx without erythema, exudate, or swelling.  Neck: supple/nontender.  No LAD, mass, or TM.  Carotid pulses 2+ bilaterally, without bruits. CV: RRR, no m/r/g.   LUNGS: CTA bilat, nonlabored resps, good aeration in all lung fields. ABD: soft, NT, ND, BS normal.  No hepatospenomegaly or mass.  No bruits. EXT: no clubbing, cyanosis, or edema.  Musculoskeletal: no joint swelling, erythema, warmth, or tenderness.  ROM of all joints intact.  Skin - no sores or suspicious lesions or rashes or color changes Over lower posterior rib area on R she has a 3-4 cm soft focal subQ mass c/w lipoma.  Pertinent labs:  Lab Results  Component Value Date   TSH 1.73 01/12/2019   Lab Results  Component Value Date   WBC 10.3 01/12/2019   HGB 13.9 01/12/2019   HCT 42.8 01/12/2019   MCV 94.1 01/12/2019   PLT 384.0 01/12/2019   Lab Results  Component Value Date   CREATININE 0.75 01/12/2019   BUN 15 01/12/2019   NA 137 01/12/2019   K 4.8 01/12/2019   CL 102 01/12/2019   CO2 28 01/12/2019   Lab Results  Component Value Date   ALT 11 01/12/2019   AST 14 01/12/2019   ALKPHOS 45 01/12/2019   BILITOT 0.4 01/12/2019   Lab Results  Component Value Date   CHOL 226 (H) 01/12/2019   Lab Results  Component Value Date   HDL 63.10 01/12/2019   Lab Results  Component Value Date   LDLCALC 141 (H) 01/12/2019   Lab Results  Component Value Date   TRIG 108.0 01/12/2019   Lab Results  Component Value Date   CHOLHDL 4 01/12/2019    ASSESSMENT AND PLAN:   1) GAD, hx of MDD--doing well. RF clonaz 1 mg bid, #180, RF x 1.  CSC and UDS UTD. Cont effexor ER, 3 of the 75mg  tabs qd.  2) Allergic rhinitis, nasal septum perf, suspect it has been there for a while. Discussed option of ENT referral but likely nothing to be done about it at this  time. Encouraged pt to start using saline nasal spray regularly.  OK to continue flonase and allegra regularly.  3) Health maintenance exam: Reviewed age and gender appropriate health maintenance issues (prudent diet, regular exercise, health risks of tobacco and excessive alcohol, use of seatbelts, fire alarms in home, use of sunscreen).  Also reviewed age and gender appropriate health screening as well as vaccine recommendations. Vaccines: Shingrix discussed->given today.  Prevnar 20 recommended (smoker)->given today..   Otherwise ALL UTD. Labs: fasting HP labs today. Cervical ca screening: per GYN MD (Hx of abnormal pap 2008--follow up paps normal). Breast ca screening: due later this year. Colon ca screening: pt has had this on hold b/c insurance issues/copay.  She has chosen to do iFOB-->ordered.  An After Visit Summary was printed and given to the patient.  FOLLOW UP:  Return in about 6 months (around 01/20/2021) for f/u anx/med.  Signed:  01/22/2021, MD           07/21/2020

## 2020-08-11 LAB — FECAL OCCULT BLOOD, IMMUNOCHEMICAL: Fecal Occult Bld: NEGATIVE

## 2020-08-14 ENCOUNTER — Encounter: Payer: Self-pay | Admitting: Family Medicine

## 2020-09-10 ENCOUNTER — Other Ambulatory Visit: Payer: Self-pay | Admitting: Family Medicine

## 2020-10-04 ENCOUNTER — Telehealth: Payer: Self-pay

## 2020-10-04 DIAGNOSIS — R921 Mammographic calcification found on diagnostic imaging of breast: Secondary | ICD-10-CM

## 2020-10-04 DIAGNOSIS — R928 Other abnormal and inconclusive findings on diagnostic imaging of breast: Secondary | ICD-10-CM

## 2020-10-04 NOTE — Telephone Encounter (Signed)
[facility] calling in regards to mammogram order.  Patient had abnormal mammogram 6 months ago, and is due for another diagnostic mammogram.  Please have Dr. Milinda Cave enter order for diagnostic mammogram   Thank you

## 2020-10-05 NOTE — Telephone Encounter (Signed)
Ok ,order entered

## 2020-10-05 NOTE — Telephone Encounter (Signed)
Please review and advise.

## 2020-10-05 NOTE — Telephone Encounter (Signed)
Spoke with pt regarding imaging, advised she should receive a call to schedule soon. She voiced understanding.

## 2020-11-14 ENCOUNTER — Telehealth: Payer: Self-pay

## 2020-11-14 NOTE — Telephone Encounter (Signed)
Per patient [facility] has not received referral order.  Patient needs follow up mammogram.  Please send to [facility] [place]  Thank you! Annabelle Harman

## 2020-11-15 NOTE — Telephone Encounter (Signed)
Pt was made aware referral order faxed. Fax confirmation received.

## 2020-11-15 NOTE — Telephone Encounter (Signed)
Pt is calling stating [facility] has not gotten this order, she has given a new fax# [phone] for [place] to send it to. Pt is wanting this done as soon as possible.

## 2020-11-16 NOTE — Telephone Encounter (Signed)
Thank you :)

## 2020-11-17 NOTE — Telephone Encounter (Signed)
[facility] called regarding order for patient.  They have not received order from [place]. I told her it has been documented that we have faxed over 3 times.  I verified the fax number [phone].  She verified that is correct.  I asked if there was another fax#.  She gave me [phone].  Faxed to [phone]. Fax confirmation rec'd.

## 2020-12-05 LAB — HM MAMMOGRAPHY

## 2020-12-07 ENCOUNTER — Telehealth: Payer: Self-pay

## 2020-12-07 NOTE — Telephone Encounter (Signed)
Received Mammogram results from [facility] on 12/05/20. Will place on PCP desk for review. HM updated

## 2020-12-08 ENCOUNTER — Other Ambulatory Visit: Payer: Self-pay | Admitting: Family Medicine

## 2021-01-05 ENCOUNTER — Other Ambulatory Visit: Payer: Self-pay | Admitting: Family Medicine

## 2021-01-05 NOTE — Telephone Encounter (Signed)
Pt has enough medication to last until 12/14 appt scheduled.

## 2021-01-10 ENCOUNTER — Ambulatory Visit: Payer: 59 | Admitting: Family Medicine

## 2021-01-10 ENCOUNTER — Other Ambulatory Visit: Payer: Self-pay

## 2021-01-10 ENCOUNTER — Encounter: Payer: Self-pay | Admitting: Family Medicine

## 2021-01-10 VITALS — BP 110/74 | HR 70 | Temp 97.5°F | Ht 62.25 in | Wt 117.2 lb

## 2021-01-10 DIAGNOSIS — Z79899 Other long term (current) drug therapy: Secondary | ICD-10-CM

## 2021-01-10 DIAGNOSIS — Z23 Encounter for immunization: Secondary | ICD-10-CM | POA: Diagnosis not present

## 2021-01-10 MED ORDER — CLONAZEPAM 1 MG PO TABS: ORAL_TABLET | ORAL | 1 refills | Status: DC

## 2021-01-10 MED ORDER — VENLAFAXINE HCL ER 75 MG PO CP24: 225.0000 mg | ORAL_CAPSULE | Freq: Every day | ORAL | 3 refills | Status: DC

## 2021-01-10 NOTE — Progress Notes (Signed)
OFFICE VISIT  01/10/2021  CC: f/u anx/dep  HPI:    Patient is a 55 y.o. female who presents for 6 mo f/u GAD and MDD in long term remission. A/P as of last visit: "1) GAD, hx of MDD--doing well. RF clonaz 1 mg bid, #180, RF x 1.  CSC and UDS UTD. Cont effexor ER, 3 of the 75mg  tabs qd.   2) Allergic rhinitis, nasal septum perf, suspect it has been there for a while. Discussed option of ENT referral but likely nothing to be done about it at this time. Encouraged pt to start using saline nasal spray regularly.  OK to continue flonase and allegra regularly.   3) Health maintenance exam: Reviewed age and gender appropriate health maintenance issues (prudent diet, regular exercise, health risks of tobacco and excessive alcohol, use of seatbelts, fire alarms in home, use of sunscreen).  Also reviewed age and gender appropriate health screening as well as vaccine recommendations. Vaccines: Shingrix discussed->given today.  Prevnar 20 recommended (smoker)->given today..   Otherwise ALL UTD. Labs: fasting HP labs today. Cervical ca screening: per GYN MD (Hx of abnormal pap 2008--follow up paps normal). Breast ca screening: due later this year. Colon ca screening: pt has had this on hold b/c insurance issues/copay.  She has chosen to do iFOB-->ordered."  INTERIM HX: Jamie Tucker feels well.  She recently went through a period of depression and irritability and some hopelessness and excessive feelings of guilt about her family situation. She and her husband have been separated long-term and she does not get to see her 2 kids as much she wants. She apparently went to [facility] weight loss and hormone therapy clinic in [place], states that she was diagnosed with low estrogen and was placed on a pellet of some kind.  She said she really started feeling better after this.  The first pellet was placed in August this year and she goes back this month to get a second.  She does not know the exact things that  are in the pellet.  She continues to abstain from alcohol and celebrated her 14-year sobriety recently.  She still leads a AA classes regularly.   Past Medical History:  Diagnosis Date   Abnormal mammogram of right breast    12/04/19->calcifications->f/u imaging "probably benign, rpt R br diag mammo 6 mo".   Adult ADHD    Alcoholism (HCC)    [facility] ICU admission 2007 for acute Alc intox,; also detox at [facility] 11/2005   Anxiety    Atypical chest pain 06/2009   Colon cancer screening    I-FOB neg 06/2020.   Depression    History of abnormal Pap smear 11/2006   ASC-US, High risk HPV negative.  Follow up paps normal.   History of ETT 06/2009   Normal/No ischemia   Low back pain    Pt reports L/S x-ray that showed deg arth in the past   Mild hyperlipidemia 12/2018   TLC   Tobacco dependence     Past Surgical History:  Procedure Laterality Date   BUNIONECTOMY  1997   Left foot   exercise treadmill test  06/2009   No ischemia   TRANSTHORACIC ECHOCARDIOGRAM  11/2009   Normal except grade I diastolic dysfunction    Outpatient Medications Prior to Visit  Medication Sig Dispense Refill   fexofenadine (ALLEGRA) 180 MG tablet Take 180 mg by mouth daily. itching     fluticasone (FLONASE) 50 MCG/ACT nasal spray Place 2 sprays into both nostrils daily.  clonazePAM (KLONOPIN) 1 MG tablet TAKE 1 TAB BY MOUTH EVERY 12 HOURS AS NEEDED FOR INCREASED ANXIETY 180 tablet 1   venlafaxine XR (EFFEXOR-XR) 75 MG 24 hr capsule TAKE 3 CAPSULES BY MOUTH DAILY. 90 capsule 0   No facility-administered medications prior to visit.    No Known Allergies  ROS As per HPI  PE: Vitals with BMI 01/10/2021 07/21/2020 01/13/2020  Height 5' 2.25" 5' 2.25" 5\' 2"   Weight 117 lbs 3 oz 111 lbs 3 oz 105 lbs  BMI 21.27 20.18 19.2  Systolic 110 101  Diastolic 74 68 90  Pulse 70 69 77     Physical Exam Vitals with BMI 01/10/2021 07/21/2020 01/13/2020  Height 5' 2.25" 5' 2.25" 5\' 2"   Weight 117 lbs 3 oz  111 lbs 3 oz 105 lbs  BMI 21.27 20.18 19.2  Systolic 110 101 01/15/2020  Diastolic 74 68 90  Pulse 70 69 77    Gen: Alert, well appearing.  Patient is oriented to person, place, time, and situation. AFFECT: pleasant, lucid thought and speech. No further exam today.  LABS:  Last CBC Lab Results  Component Value Date   WBC 6.0 07/21/2020   HGB 12.2 07/21/2020   HCT 36.5 07/21/2020   MCV 90.7 07/21/2020   MCH 29.9 10/02/2010   RDW 13.0 07/21/2020   PLT 318.0 07/21/2020   Last metabolic panel Lab Results  Component Value Date   GLUCOSE 82 07/21/2020   NA 138 07/21/2020   K 4.3 07/21/2020   CL 105 07/21/2020   CO2 25 07/21/2020   BUN 15 07/21/2020   CREATININE 0.70 07/21/2020   CALCIUM 9.7 07/21/2020   PROT 6.7 07/21/2020   ALBUMIN 4.4 07/21/2020   BILITOT 0.3 07/21/2020   ALKPHOS 38 (L) 07/21/2020   AST 12 07/21/2020   ALT 9 07/21/2020   Last lipids Lab Results  Component Value Date   CHOL 197 07/21/2020   HDL 72.10 07/21/2020   LDLCALC 116 (H) 07/21/2020   TRIG 44.0 07/21/2020   CHOLHDL 3 07/21/2020   Last thyroid functions Lab Results  Component Value Date   TSH 1.99 07/21/2020   IMPRESSION AND PLAN:  #1 anxiety, well controlled on clonazepam 1 mg twice a day and venlafaxine xr 225 mg a day. Depression has been well controlled on this historically as well.  However she suffered what sounds like menopausal syndrome since I last saw her and was evaluated and treated by a weight loss and hormone therapy clinic.  She says this helped quite a bit and feels good now.  We will try to get these records.  She also notes that she was put on a thyroid supplement (but states she was told her thyroid level was normal)--Liothyronine 5 mcg qd.  However, this medicine made her feel irritable and anxious and she stopped it. CSC updated. UDS today. Clonaz 1mg , 1 bid, #180, rf x 1. Venlafaxine xr 75mg , 3 tabs qd.  An After Visit Summary was printed and given to the  patient.  FOLLOW UP: Return in about 6 months (around 07/11/2021) for annual CPE (fasting).  Signed:  07/23/2020, MD           01/10/2021

## 2021-01-12 LAB — DRUG MONITORING PANEL 376104, URINE
Amphetamines: NEGATIVE ng/mL
Barbiturates: NEGATIVE ng/mL
Benzodiazepines: NEGATIVE ng/mL
Cocaine Metabolite: NEGATIVE ng/mL
Desmethyltramadol: NEGATIVE ng/mL
Opiates: NEGATIVE ng/mL
Oxycodone: NEGATIVE ng/mL
Tramadol: NEGATIVE ng/mL

## 2021-01-12 LAB — DM TEMPLATE

## 2021-06-29 ENCOUNTER — Encounter: Payer: Self-pay | Admitting: Family Medicine

## 2021-06-29 ENCOUNTER — Ambulatory Visit: Payer: 59 | Admitting: Family Medicine

## 2021-06-29 VITALS — BP 105/73 | HR 94 | Temp 98.0°F | Ht 62.25 in | Wt 117.8 lb

## 2021-06-29 DIAGNOSIS — F988 Other specified behavioral and emotional disorders with onset usually occurring in childhood and adolescence: Secondary | ICD-10-CM | POA: Diagnosis not present

## 2021-06-29 DIAGNOSIS — F411 Generalized anxiety disorder: Secondary | ICD-10-CM | POA: Diagnosis not present

## 2021-06-29 DIAGNOSIS — F3342 Major depressive disorder, recurrent, in full remission: Secondary | ICD-10-CM

## 2021-06-29 MED ORDER — LISDEXAMFETAMINE DIMESYLATE 30 MG PO CAPS: 30.0000 mg | ORAL_CAPSULE | Freq: Every day | ORAL | 0 refills | Status: DC

## 2021-06-29 MED ORDER — CLONAZEPAM 1 MG PO TABS: ORAL_TABLET | ORAL | 1 refills | Status: DC

## 2021-06-29 NOTE — Progress Notes (Signed)
OFFICE VISIT  06/29/2021  CC:  Chief Complaint  Patient presents with   ADD    Pt would like to discuss restarting Vyvanse    Patient is a 56 y.o. female who presents for 6 mo f/u GAD and recurrent MDD, also adult ADD. A/P as of last visit: "#1 anxiety, well controlled on clonazepam 1 mg twice a day and venlafaxine xr 225 mg a day. Depression has been well controlled on this historically as well.  However she suffered what sounds like menopausal syndrome since I last saw her and was evaluated and treated by a weight loss and hormone therapy clinic.  She says this helped quite a bit and feels good now.  We will try to get these records.  She also notes that she was put on a thyroid supplement (but states she was told her thyroid level was normal)--Liothyronine 5 mcg qd.  However, this medicine made her feel irritable and anxious and she stopped it. CSC updated. UDS today. Clonaz 1mg , 1 bid, #180, rf x 1. Venlafaxine xr 75mg , 3 tabs qd."  INTERIM HX: Tammy is doing well. Mood has been stable, no prolonged ups or downs. Anxiety level minimal.  Sleeping well. She has always had some inattentive and focus issues. Her mind/thoughts seem to be garbled a lot and she has trouble keeping on task.  Often loses motivation to do things because of fear of being easily frustrated with this. Has been on ADD medicine in the past: Has not been on vyvanse since 2015. Has not been on adderall since 2019.  PMP AWARE reviewed today: most recent rx for clonaz was filled 05/09/21, # 180, rx by me. No red flags.  ROS as above, plus--> no menses in the last 6 months.   No fevers, no CP, no SOB, no wheezing, no cough, no dizziness, no HAs, no rashes, no melena/hematochezia.  No polyuria or polydipsia.  No myalgias or arthralgias.  No focal weakness, paresthesias, or tremors.  No acute vision or hearing abnormalities.  No dysuria or unusual/new urinary urgency or frequency.  No recent changes in lower legs. No  n/v/d or abd pain.  No palpitations.    Past Medical History:  Diagnosis Date   Abnormal mammogram of right breast    12/04/19->calcifications->f/u imaging "probably benign, rpt R br diag mammo 6 mo".   Adult ADHD    Alcoholism ([place])    [facility] ICU admission 2007 for acute Alc intox,; also detox at [facility] 11/2005   Anxiety    Atypical chest pain 06/2009   Colon cancer screening    I-FOB neg 06/2020.   Depression    History of abnormal Pap smear 11/2006   ASC-US, High risk HPV negative.  Follow up paps normal.   History of ETT 06/2009   Normal/No ischemia   Low back pain    Pt reports L/S x-ray that showed deg arth in the past   Mild hyperlipidemia 12/2018   TLC   Tobacco dependence     Past Surgical History:  Procedure Laterality Date   BUNIONECTOMY  1997   Left foot   exercise treadmill test  06/2009   No ischemia   TRANSTHORACIC ECHOCARDIOGRAM  11/2009   Normal except grade I diastolic dysfunction    Outpatient Medications Prior to Visit  Medication Sig Dispense Refill   fexofenadine (ALLEGRA) 180 MG tablet Take 180 mg by mouth daily. itching     fluticasone (FLONASE) 50 MCG/ACT nasal spray Place 2 sprays into both nostrils  daily.     venlafaxine XR (EFFEXOR-XR) 75 MG 24 hr capsule Take 3 capsules (225 mg total) by mouth daily. 270 capsule 3   clonazePAM (KLONOPIN) 1 MG tablet TAKE 1 TAB BY MOUTH EVERY 12 HOURS AS NEEDED FOR INCREASED ANXIETY 180 tablet 1   No facility-administered medications prior to visit.    No Known Allergies  ROS As per HPI  PE:    06/29/2021    2:41 PM 01/10/2021    3:57 PM 07/21/2020   10:23 AM  Vitals with BMI  Height 5' 2.25" 5' 2.25" 5' 2.25"  Weight 117 lbs 13 oz 117 lbs 3 oz 111 lbs 3 oz  BMI 21.38 [id number]  Systolic [id number]  Diastolic 73 74 68  Pulse 94 70 69   Physical Exam  Gen: Alert, well appearing.  Patient is oriented to person, place, time, and situation. AFFECT: pleasant, lucid thought and speech. No further exam  today  LABS:  Last CBC Lab Results  Component Value Date   WBC 6.0 07/21/2020   HGB 12.2 07/21/2020   HCT 36.5 07/21/2020   MCV 90.7 07/21/2020   MCH 29.9 10/02/2010   RDW 13.0 07/21/2020   PLT 318.0 [id number]   Last metabolic panel Lab Results  Component Value Date   GLUCOSE 82 07/21/2020   NA 138 07/21/2020   K 4.3 07/21/2020   CL 105 07/21/2020   CO2 25 07/21/2020   BUN 15 07/21/2020   CREATININE 0.70 07/21/2020   CALCIUM 9.7 07/21/2020   PROT 6.7 07/21/2020   ALBUMIN 4.4 07/21/2020   BILITOT 0.3 07/21/2020   ALKPHOS 38 (L) 07/21/2020   AST 12 07/21/2020   ALT 9 07/21/2020   Last thyroid functions Lab Results  Component Value Date   TSH 1.99 07/21/2020   IMPRESSION AND PLAN:  #1 GAD, recurrent MDD in remission. Continue 225 mg Effexor XR daily.  Continue clonazepam 1 mg twice daily as needed. Plan repeat urine tox screen 6 months.  #2 adult ADD. We will get her back on Vyvanse 30 mg a day.  Prescription for #30 today. Controlled substance contract will be updated.  An After Visit Summary was printed and given to the patient.  FOLLOW UP: Return in about 2 months (around 08/29/2021) for Follow-up ADD.  Signed:  Crissie Sickles, MD           06/29/2021

## 2021-07-02 ENCOUNTER — Encounter: Payer: Self-pay | Admitting: Family Medicine

## 2021-07-02 NOTE — Telephone Encounter (Signed)
Must take Vyvanse only 1 Daily.   Adderall will not be filled within 30 days of the Vyvanse being dispensed. When you are about 3 days from running out of the Vyvanse and call our office and I will send a prescription for the Adderall 20 mg which you will take twice daily.  Keep plan for follow-up in 2 months. Thx

## 2021-07-17 ENCOUNTER — Ambulatory Visit: Payer: 59 | Admitting: Family Medicine

## 2021-07-25 MED ORDER — AMPHETAMINE-DEXTROAMPHETAMINE 20 MG PO TABS: 20.0000 mg | ORAL_TABLET | Freq: Two times a day (BID) | ORAL | 0 refills | Status: DC

## 2021-07-25 NOTE — Telephone Encounter (Signed)
Patient calling for prescription to be sent in for Adderall.  [facility] [place]

## 2021-07-25 NOTE — Telephone Encounter (Signed)
Adderall rx pending. Last rx for Vyvanse 06/29/21(30,0)  Please review and advise

## 2021-08-22 ENCOUNTER — Encounter: Payer: Self-pay | Admitting: Family Medicine

## 2021-08-22 ENCOUNTER — Telehealth (INDEPENDENT_AMBULATORY_CARE_PROVIDER_SITE_OTHER): Payer: 59 | Admitting: Family Medicine

## 2021-08-22 VITALS — Ht 62.25 in | Wt 112.0 lb

## 2021-08-22 DIAGNOSIS — F988 Other specified behavioral and emotional disorders with onset usually occurring in childhood and adolescence: Secondary | ICD-10-CM

## 2021-08-22 MED ORDER — AMPHETAMINE-DEXTROAMPHETAMINE 20 MG PO TABS
ORAL_TABLET | ORAL | 0 refills | Status: DC
Start: 2021-08-22 — End: 2021-09-17

## 2021-08-22 NOTE — Progress Notes (Signed)
Virtual Visit via Video Note  I connected with Jamie Tucker on 08/22/21 at  8:20 AM EDT by a video enabled telemedicine application and verified that I am speaking with the correct person using two identifiers.  Location patient: [place] Location provider:work or home office Persons participating in the virtual visit: patient, provider  I discussed the limitations and requested verbal permission for telemedicine visit. The patient expressed understanding and agreed to proceed.   56 y/o female being seen today for 1 month f/u adult ADD. A/P as of last visit: "#1 GAD, recurrent MDD in remission. Continue 225 mg Effexor XR daily.  Continue clonazepam 1 mg twice daily as needed. Plan repeat urine tox screen 6 months.   #2 adult ADD. We will get her back on Vyvanse 30 mg a day.  Prescription for #30 today. Controlled substance contract will be updated."  INTERIM HX: Vyvanse ended up being too costly so we switched her to Adderall 20 mg twice daily.  The Adderall seems to help but still has a mild amount of residual ADD symptoms. Also, she finds that the duration of action is not sufficient enough for the length of time for therapeutic effect due to her work and busy schedule after work.  Adderall does result in improved focus, concentration, task completion.  Less frustration, better multitasking, less impulsivity and restlessness.  Mood is stable. No side effects from the medication.   Immunization History  Administered Date(s) Administered   Influenza Split 11/19/2010   Influenza,inj,Quad PF,6+ Mos 11/18/2016, 12/31/2018, 01/13/2020, 01/10/2021   Influenza,inj,Quad PF,6-35 Mos 10/28/2013   Influenza-Unspecified 11/19/2015   PFIZER Comirnaty(Gray Top)Covid-19 Tri-Sucrose Vaccine 04/03/2020   PFIZER(Purple Top)SARS-COV-2 Vaccination 09/14/2019, 10/05/2019   PNEUMOCOCCAL CONJUGATE-20 07/21/2020   Tdap 12/31/2018   Zoster Recombinat (Shingrix) 07/21/2020, 01/10/2021     ROS: See pertinent  positives and negatives per HPI.  Past Medical History:  Diagnosis Date   Abnormal mammogram of right breast    12/04/19->calcifications->f/u imaging "probably benign, rpt R br diag mammo 6 mo".   Adult ADHD    Alcoholism (HCC)    [facility] ICU admission 2007 for acute Alc intox,; also detox at [facility] 11/2005   Anxiety    Atypical chest pain 06/2009   Colon cancer screening    I-FOB neg 06/2020.   Depression    History of abnormal Pap smear 11/2006   ASC-US, High risk HPV negative.  Follow up paps normal.   History of ETT 06/2009   Normal/No ischemia   Low back pain    Pt reports L/S x-ray that showed deg arth in the past   Mild hyperlipidemia 12/2018   TLC   Tobacco dependence     Past Surgical History:  Procedure Laterality Date   BUNIONECTOMY  1997   Left foot   exercise treadmill test  06/2009   No ischemia   TRANSTHORACIC ECHOCARDIOGRAM  11/2009   Normal except grade I diastolic dysfunction     Current Outpatient Medications:    amphetamine-dextroamphetamine (ADDERALL) 20 MG tablet, Take 1 tablet (20 mg total) by mouth 2 (two) times daily., Disp: 60 tablet, Rfl: 0   augmented betamethasone dipropionate (DIPROLENE-AF) 0.05 % cream, , Disp: , Rfl:    clonazePAM (KLONOPIN) 1 MG tablet, TAKE 1 TAB BY MOUTH EVERY 12 HOURS AS NEEDED FOR INCREASED ANXIETY, Disp: 180 tablet, Rfl: 1   fexofenadine (ALLEGRA) 180 MG tablet, Take 180 mg by mouth daily. itching, Disp: , Rfl:    fluticasone (FLONASE) 50 MCG/ACT nasal spray, Place 2 sprays  into both nostrils daily., Disp: , Rfl:    venlafaxine XR (EFFEXOR-XR) 75 MG 24 hr capsule, Take 3 capsules (225 mg total) by mouth daily., Disp: 270 capsule, Rfl: 3  EXAM:  VITALS per patient if applicable:     08/22/2021    8:26 AM 06/29/2021    2:41 PM 01/10/2021    3:57 PM  Vitals with BMI  Height 5' 2.25" 5' 2.25" 5' 2.25"  Weight 112 lbs 117 lbs 13 oz 117 lbs 3 oz  BMI 20.32 21.38 21.27  Systolic  105 110  Diastolic  73 74  Pulse  94 70     GENERAL: alert, oriented, appears well and in no acute distress  HEENT: atraumatic, conjunttiva clear, no obvious abnormalities on inspection of external nose and ears  NECK: normal movements of the head and neck  LUNGS: on inspection no signs of respiratory distress, breathing rate appears normal, no obvious gross SOB, gasping or wheezing  CV: no obvious cyanosis  MS: moves all visible extremities without noticeable abnormality  PSYCH/NEURO: pleasant and cooperative, no obvious depression or anxiety, speech and thought processing grossly intact  LABS: none today    Chemistry      Component Value Date/Time   NA 138 07/21/2020 1112   K 4.3 07/21/2020 1112   CL 105 07/21/2020 1112   CO2 25 07/21/2020 1112   BUN 15 07/21/2020 1112   CREATININE 0.70 07/21/2020 1112   CREATININE 0.62 10/02/2010 1507      Component Value Date/Time   CALCIUM 9.7 07/21/2020 1112   ALKPHOS 38 (L) 07/21/2020 1112   AST 12 07/21/2020 1112   ALT 9 07/21/2020 1112   BILITOT 0.3 07/21/2020 1112     ASSESSMENT AND PLAN:  Discussed the following assessment and plan:  Adult ADD. Discussed possible change of Adderall to the XR formulation in the morning and a dose of immediate release/short acting in the late afternoon. However, she wished to avoid 2 different prescriptions. Decided to continue the immediate release/short acting Adderall at the 20 mg dose but increased to 3 times a day. #90 prescribed today.  She will follow-up in 2 months regarding ADD/meds and chronic anxiety meds.   I discussed the assessment and treatment plan with the patient. The patient was provided an opportunity to ask questions and all were answered. The patient agreed with the plan and demonstrated an understanding of the instructions.   F/u: 2 months  Signed:  Santiago Bumpers, MD           08/22/2021

## 2021-09-17 ENCOUNTER — Other Ambulatory Visit: Payer: Self-pay | Admitting: Family Medicine

## 2021-09-17 MED ORDER — AMPHETAMINE-DEXTROAMPHETAMINE 20 MG PO TABS
ORAL_TABLET | ORAL | 0 refills | Status: DC
Start: 2021-09-17 — End: 2021-10-11

## 2021-09-17 NOTE — Telephone Encounter (Signed)
Pt needs refill for Adderall

## 2021-09-17 NOTE — Telephone Encounter (Signed)
Requesting: adderall  Contract: N/A UDS: 01/10/21 Last Visit: 06/29/21 Next Visit: 2 month follow up advised Last Refill: 08/22/21(90,0)   Please Advise. Med pending

## 2021-09-17 NOTE — Telephone Encounter (Signed)
Pt advised refill sent. °

## 2021-10-11 ENCOUNTER — Telehealth: Payer: Self-pay

## 2021-10-11 ENCOUNTER — Other Ambulatory Visit: Payer: Self-pay

## 2021-10-11 MED ORDER — AMPHETAMINE-DEXTROAMPHETAMINE 20 MG PO TABS
ORAL_TABLET | ORAL | 0 refills | Status: DC
Start: 2021-10-11 — End: 2021-11-15

## 2021-10-11 NOTE — Telephone Encounter (Signed)
Based on last visit, Dr.McGowen mentioned "she will follow-up in 2 months regarding ADD/meds and chronic anxiety meds."

## 2021-10-11 NOTE — Telephone Encounter (Signed)
Requesting: adderall  Contract: 01/10/21 UDS: 01/10/21 Last Visit: 08/22/21 Next Visit: 2 mo f/u OV Last Refill: 09/17/21(90,0)  Please Advise. Med pending

## 2021-10-11 NOTE — Telephone Encounter (Signed)
Pt advised.

## 2021-10-11 NOTE — Telephone Encounter (Signed)
Pt advised refill sent. °

## 2021-10-11 NOTE — Telephone Encounter (Signed)
Patient calling in regards to when does she need to come back in for follow up on medication checkup/refills.  Patient states Dr. Milinda Cave agreed to seeing her in combined visit for Adderall and Effexor,Klonopin, instead of having 2 separate visits.  Patient is requesting appt.  Please advise when appt needs to be scheduled.

## 2021-10-11 NOTE — Telephone Encounter (Signed)
Adderall prescription sent.

## 2021-10-12 ENCOUNTER — Telehealth: Payer: Self-pay

## 2021-10-12 NOTE — Telephone Encounter (Signed)
Patient has faxed patient assistance form to our office for Vyvanse medication. She was previously on this medication as of 6/2 but switched due to cost of medication. She would like forms to be completed because this medication works better for her.  Placed on PCP desk to review and sign, if appropriate.

## 2021-11-15 ENCOUNTER — Telehealth: Payer: 59 | Admitting: Family Medicine

## 2021-11-15 NOTE — Progress Notes (Deleted)
OFFICE VISIT  11/15/2021  CC: No chief complaint on file.   Patient is a 56 y.o. female who presents for f/u adult ADD and generalized anxiety disorder. I last saw her about 2 and 1/2 months ago. A/P as of that visit: "Adult ADD. Discussed possible change of Adderall to the XR formulation in the morning and a dose of immediate release/short acting in the late afternoon. However, she wished to avoid 2 different prescriptions. Decided to continue the immediate release/short acting Adderall at the 20 mg dose but increased to 3 times a day. #90 prescribed today."  INTERIM HX: ***   PMP AWARE reviewed today: most recent rx for Adderall 20 mg was filled 10/18/2021, #90, rx by me. Most recent clonazepam prescription was filled 08/05/2021, #180, prescription by me. No red flags.  Past Medical History:  Diagnosis Date   Abnormal mammogram of right breast    12/04/19->calcifications->f/u imaging "probably benign, rpt R br diag mammo 6 mo".   Adult ADHD    Alcoholism ([place])    [facility] ICU admission 2007 for acute Alc intox,; also detox at [facility] 11/2005   Anxiety    Atypical chest pain 06/2009   Colon cancer screening    I-FOB neg 06/2020.   Depression    History of abnormal Pap smear 11/2006   ASC-US, High risk HPV negative.  Follow up paps normal.   History of ETT 06/2009   Normal/No ischemia   Low back pain    Pt reports L/S x-ray that showed deg arth in the past   Mild hyperlipidemia 12/2018   TLC   Tobacco dependence     Past Surgical History:  Procedure Laterality Date   BUNIONECTOMY  1997   Left foot   exercise treadmill test  06/2009   No ischemia   TRANSTHORACIC ECHOCARDIOGRAM  11/2009   Normal except grade I diastolic dysfunction    Outpatient Medications Prior to Visit  Medication Sig Dispense Refill   amphetamine-dextroamphetamine (ADDERALL) 20 MG tablet 1 tab po tid 90 tablet 0   augmented betamethasone dipropionate (DIPROLENE-AF) 0.05 % cream      clonazePAM  (KLONOPIN) 1 MG tablet TAKE 1 TAB BY MOUTH EVERY 12 HOURS AS NEEDED FOR INCREASED ANXIETY 180 tablet 1   fexofenadine (ALLEGRA) 180 MG tablet Take 180 mg by mouth daily. itching     fluticasone (FLONASE) 50 MCG/ACT nasal spray Place 2 sprays into both nostrils daily.     venlafaxine XR (EFFEXOR-XR) 75 MG 24 hr capsule Take 3 capsules (225 mg total) by mouth daily. 270 capsule 3   No facility-administered medications prior to visit.    No Known Allergies  ROS As per HPI  PE:    08/22/2021    8:26 AM 06/29/2021    2:41 PM 01/10/2021    3:57 PM  Vitals with BMI  Height 5' 2.25" 5' 2.25" 5' 2.25"  Weight 112 lbs 117 lbs 13 oz 117 lbs 3 oz  BMI 20.32 06.26 94.85  Systolic  462 703  Diastolic  73 74  Pulse  94 70     Physical Exam  ***  LABS:  Last CBC Lab Results  Component Value Date   WBC 6.0 07/21/2020   HGB 12.2 07/21/2020   HCT 36.5 07/21/2020   MCV 90.7 07/21/2020   MCH 29.9 10/02/2010   RDW 13.0 07/21/2020   PLT 318.0 50/09/3816   Last metabolic panel Lab Results  Component Value Date   GLUCOSE 82 07/21/2020   NA 138  07/21/2020   K 4.3 07/21/2020   CL 105 07/21/2020   CO2 25 07/21/2020   BUN 15 07/21/2020   CREATININE 0.70 07/21/2020   CALCIUM 9.7 07/21/2020   PROT 6.7 07/21/2020   ALBUMIN 4.4 07/21/2020   BILITOT 0.3 07/21/2020   ALKPHOS 38 (L) 07/21/2020   AST 12 07/21/2020   ALT 9 07/21/2020   IMPRESSION AND PLAN:  No problem-specific Assessment & Plan notes found for this encounter.   An After Visit Summary was printed and given to the patient.  FOLLOW UP: No follow-ups on file. Cpe 3 mo  Signed:  Santiago Bumpers, MD           11/15/2021

## 2021-11-16 ENCOUNTER — Telehealth (INDEPENDENT_AMBULATORY_CARE_PROVIDER_SITE_OTHER): Payer: 59 | Admitting: Family Medicine

## 2021-11-16 ENCOUNTER — Encounter: Payer: Self-pay | Admitting: Family Medicine

## 2021-11-16 VITALS — Ht 62.25 in | Wt 112.0 lb

## 2021-11-16 DIAGNOSIS — F988 Other specified behavioral and emotional disorders with onset usually occurring in childhood and adolescence: Secondary | ICD-10-CM

## 2021-11-16 DIAGNOSIS — F411 Generalized anxiety disorder: Secondary | ICD-10-CM | POA: Diagnosis not present

## 2021-11-16 MED ORDER — AMPHETAMINE-DEXTROAMPHETAMINE 20 MG PO TABS
ORAL_TABLET | ORAL | 0 refills | Status: DC
Start: 2021-11-16 — End: 2021-12-13

## 2021-11-16 MED ORDER — AMPHETAMINE-DEXTROAMPHET ER 30 MG PO CP24: ORAL_CAPSULE | ORAL | 0 refills | Status: DC

## 2021-11-16 NOTE — Progress Notes (Signed)
Virtual Visit via Video Note  I connected with Jamie Tucker  on 11/16/21 at 10:20 AM EDT by a video enabled telemedicine application and verified that I am speaking with the correct person using two identifiers.  Location patient: [place] Location provider:work or home office Persons participating in the virtual visit: patient, provider  I discussed the limitations and requested verbal permission for telemedicine visit. The patient expressed understanding and agreed to proceed.  CC: 56 year old female being seen today for 58-month follow-up adult ADD and GAD/history of major depressive disorder. A/P as of last visit: "Adult ADD. Discussed possible change of Adderall to the XR formulation in the morning and a dose of immediate release/short acting in the late afternoon. However, she wished to avoid 2 different prescriptions. Decided to continue the immediate release/short acting Adderall at the 20 mg dose but increased to 3 times a day. #90 prescribed today."  INTERIM HX: Feels like the Adderall 20 mg 3 times daily is not very effective. Feeling overwhelmed, cannot focus or concentrate,. Says the Vyvanse at 70 mg daily was very effective but it was going to cost her $300 a month.  Anxiety is increased due to work stress and also her Facebook account was recently hacked. Anxiety is also increased due to poor control of her ADD. Mood sometimes significantly down but nothing prolonged.   PMP AWARE reviewed today: most recent rx for Adderall 20 mg was filled 09/28/2021, #90, rx by me. Most recent clonazepam prescription filled 08/05/2021, #180, prescription by me. No red flags.  ROS: See pertinent positives and negatives per HPI.  Past Medical History:  Diagnosis Date   Abnormal mammogram of right breast    12/04/19->calcifications->f/u imaging "probably benign, rpt R br diag mammo 6 mo".   Adult ADHD    Alcoholism ([place])    [facility] ICU admission 2007 for acute Alc intox,; also detox at [facility] 11/2005    Anxiety    Atypical chest pain 06/2009   Colon cancer screening    I-FOB neg 06/2020.   Depression    History of abnormal Pap smear 11/2006   ASC-US, High risk HPV negative.  Follow up paps normal.   History of ETT 06/2009   Normal/No ischemia   Low back pain    Pt reports L/S x-ray that showed deg arth in the past   Mild hyperlipidemia 12/2018   TLC   Tobacco dependence     Past Surgical History:  Procedure Laterality Date   BUNIONECTOMY  1997   Left foot   exercise treadmill test  06/2009   No ischemia   TRANSTHORACIC ECHOCARDIOGRAM  11/2009   Normal except grade I diastolic dysfunction     Current Outpatient Medications:    amphetamine-dextroamphetamine (ADDERALL) 20 MG tablet, 1 tab po tid, Disp: 90 tablet, Rfl: 0   clonazePAM (KLONOPIN) 1 MG tablet, TAKE 1 TAB BY MOUTH EVERY 12 HOURS AS NEEDED FOR INCREASED ANXIETY, Disp: 180 tablet, Rfl: 1   fexofenadine (ALLEGRA) 180 MG tablet, Take 180 mg by mouth daily. itching, Disp: , Rfl:    venlafaxine XR (EFFEXOR-XR) 75 MG 24 hr capsule, Take 3 capsules (225 mg total) by mouth daily., Disp: 270 capsule, Rfl: 3   augmented betamethasone dipropionate (DIPROLENE-AF) 0.05 % cream, , Disp: , Rfl:    fluticasone (FLONASE) 50 MCG/ACT nasal spray, Place 2 sprays into both nostrils daily. (Patient not taking: Reported on 11/16/2021), Disp: , Rfl:   EXAM:  VITALS per patient if applicable:     51/76/1607  10:21 AM 08/22/2021    8:26 AM 06/29/2021    2:41 PM  Vitals with BMI  Height 5' 2.25" 5' 2.25" 5' 2.25"  Weight 112 lbs 112 lbs 117 lbs 13 oz  BMI 20.32 20.32 21.38  Systolic   105  Diastolic   73  Pulse   94     GENERAL: alert, oriented, appears well and in no acute distress  HEENT: atraumatic, conjunttiva clear, no obvious abnormalities on inspection of external nose and ears  NECK: normal movements of the head and neck  LUNGS: on inspection no signs of respiratory distress, breathing rate appears normal, no obvious  gross SOB, gasping or wheezing  CV: no obvious cyanosis  MS: moves all visible extremities without noticeable abnormality  PSYCH/NEURO: pleasant and cooperative, no obvious depression or anxiety, speech and thought processing grossly intact  LABS: none today   Chemistry      Component Value Date/Time   NA 138 07/21/2020 1112   K 4.3 07/21/2020 1112   CL 105 07/21/2020 1112   CO2 25 07/21/2020 1112   BUN 15 07/21/2020 1112   CREATININE 0.70 07/21/2020 1112   CREATININE 0.62 10/02/2010 1507      Component Value Date/Time   CALCIUM 9.7 07/21/2020 1112   ALKPHOS 38 (L) 07/21/2020 1112   AST 12 07/21/2020 1112   ALT 9 07/21/2020 1112   BILITOT 0.3 07/21/2020 1112      ASSESSMENT AND PLAN:  Discussed the following assessment and plan:  #1 adult ADD, not well controlled on Adderall 20 mg 3 times daily. Vyvanse very effective for her but too costly. We will do trial of Adderall XR 30 mg a day with an Adderall 20 mg in the late afternoon.  #2 chronic anxiety.  Continue clonazepam and Effexor XR.   I discussed the assessment and treatment plan with the patient. The patient was provided an opportunity to ask questions and all were answered. The patient agreed with the plan and demonstrated an understanding of the instructions.   F/u: 3 mo  Signed:  Santiago Bumpers, MD           11/16/2021

## 2021-12-13 ENCOUNTER — Other Ambulatory Visit: Payer: Self-pay | Admitting: Family Medicine

## 2021-12-13 MED ORDER — AMPHETAMINE-DEXTROAMPHETAMINE 20 MG PO TABS: ORAL_TABLET | ORAL | 0 refills | Status: DC

## 2021-12-13 MED ORDER — AMPHETAMINE-DEXTROAMPHET ER 30 MG PO CP24: ORAL_CAPSULE | ORAL | 0 refills | Status: DC

## 2021-12-13 NOTE — Telephone Encounter (Signed)
Requesting: Adderall XR 30mg   Contract: 01/10/21 UDS: 01/10/21 Last Visit: 11/16/21 Next Visit: f/u 3 months Last Refill: 11/16/21 (30,0)  Requesting: Adderall 20mg   Contract: 01/10/21 UDS: 01/10/21 Last Visit: 11/16/21 Next Visit: f/u 3 months Last Refill: 11/16/21 (30,0)  Please Advise. Meds pending

## 2021-12-13 NOTE — Telephone Encounter (Signed)
Pt advised refills sent. °

## 2021-12-13 NOTE — Telephone Encounter (Signed)
Pt is requesting that a refill for her Adderall be called in to her pharmacy. She wanted to reques this early due to the weekend approaching and does not want to be without she is requesting both regular Adderall and the extended release.

## 2021-12-19 ENCOUNTER — Telehealth: Payer: Self-pay

## 2021-12-19 MED ORDER — AMPHETAMINE-DEXTROAMPHET ER 30 MG PO CP24: ORAL_CAPSULE | ORAL | 0 refills | Status: DC

## 2021-12-19 NOTE — Telephone Encounter (Signed)
Please advise 

## 2021-12-19 NOTE — Telephone Encounter (Signed)
Patient stated that prescription was not in stock at her current pharmacy for Adderall 30mg . The 20mg  they had in stock so she only needs the 30 mg.  [facility] [address] [place]  Requesting: Adderall XR 30mg   Contract: 01/10/21 UDS: 01/10/21 Last Visit: 11/16/21 Next Visit: f/u 3 months Last Refill: 11/16/21 (30,0)  Please call patient when completed. [phone]

## 2021-12-19 NOTE — Telephone Encounter (Signed)
OK, adderall xr 30mg  rx sent

## 2022-01-11 ENCOUNTER — Other Ambulatory Visit: Payer: Self-pay | Admitting: Family Medicine

## 2022-01-14 ENCOUNTER — Telehealth: Payer: Self-pay | Admitting: Family Medicine

## 2022-01-14 MED ORDER — AMPHETAMINE-DEXTROAMPHETAMINE 20 MG PO TABS: ORAL_TABLET | ORAL | 0 refills | Status: DC

## 2022-01-14 MED ORDER — AMPHETAMINE-DEXTROAMPHET ER 30 MG PO CP24: ORAL_CAPSULE | ORAL | 0 refills | Status: DC

## 2022-01-14 NOTE — Telephone Encounter (Signed)
Requesting: Adderall XR 30mg   Contract: 01/10/21 UDS: 01/10/21 Last Visit: 11/16/21 Next Visit: f/u 3 months Last Refill: 12/19/21 (30,0)

## 2022-01-14 NOTE — Telephone Encounter (Signed)
Pt is calling ahead of time to make the medication refill request for her Adderall both dosages.  She mentioned that its not to for a couple days now.

## 2022-02-08 ENCOUNTER — Other Ambulatory Visit: Payer: Self-pay

## 2022-02-08 MED ORDER — AMPHETAMINE-DEXTROAMPHETAMINE 20 MG PO TABS: ORAL_TABLET | ORAL | 0 refills | Status: DC

## 2022-02-08 MED ORDER — AMPHETAMINE-DEXTROAMPHET ER 30 MG PO CP24: ORAL_CAPSULE | ORAL | 0 refills | Status: DC

## 2022-02-08 NOTE — Telephone Encounter (Signed)
Last refill for both was 01/04/22. She was last seen 11/16/21 virtual visit.  Her last urine drug screen  and controlled agreement were 01/10/21.  Please advise on refill and if appt is needed. Meds pending

## 2022-02-08 NOTE — Telephone Encounter (Signed)
Patient requesting refill.  She states she is aware that she needs an appt, and asking if she can do this visit virtual.  She also states that she is transferring to another PCP, and this will be last refill request and visit.  Last in office appt 07/17/21.  Does she need in office appt or can we schedule video visit?  [facility] [place]  amphetamine-dextroamphetamine (ADDERALL XR) 30 MG 24 hr capsule   amphetamine-dextroamphetamine (ADDERALL) 20 MG tablet

## 2022-02-08 NOTE — Telephone Encounter (Signed)
Yes virtual visit is okay

## 2022-02-11 NOTE — Telephone Encounter (Signed)
Please assist patient with scheduling, thanks. 

## 2022-02-11 NOTE — Telephone Encounter (Signed)
Spoke to patient and scheduled virtual appt for 1/24 with Dr. Anitra Lauth

## 2022-02-20 ENCOUNTER — Encounter: Payer: Self-pay | Admitting: Family Medicine

## 2022-02-20 ENCOUNTER — Telehealth (INDEPENDENT_AMBULATORY_CARE_PROVIDER_SITE_OTHER): Payer: 59 | Admitting: Family Medicine

## 2022-02-20 DIAGNOSIS — F411 Generalized anxiety disorder: Secondary | ICD-10-CM | POA: Diagnosis not present

## 2022-02-20 DIAGNOSIS — F988 Other specified behavioral and emotional disorders with onset usually occurring in childhood and adolescence: Secondary | ICD-10-CM | POA: Diagnosis not present

## 2022-02-20 DIAGNOSIS — F3342 Major depressive disorder, recurrent, in full remission: Secondary | ICD-10-CM | POA: Diagnosis not present

## 2022-02-20 MED ORDER — AMPHETAMINE-DEXTROAMPHETAMINE 20 MG PO TABS: ORAL_TABLET | ORAL | 0 refills | Status: DC

## 2022-02-20 MED ORDER — CLONAZEPAM 1 MG PO TABS: ORAL_TABLET | ORAL | 1 refills | Status: DC

## 2022-02-20 MED ORDER — AMPHETAMINE-DEXTROAMPHET ER 30 MG PO CP24: ORAL_CAPSULE | ORAL | 0 refills | Status: DC

## 2022-02-20 NOTE — Progress Notes (Signed)
Virtual Visit via Video Note  I connected with Jamie Tucker  on 02/20/22 at  1:00 PM EST by a video enabled telemedicine application and verified that I am speaking with the correct person using two identifiers.  Location patient: [place] Location provider:work or home office Persons participating in the virtual visit: patient, provider  I discussed the limitations and requested verbal permission for telemedicine visit. The patient expressed understanding and agreed to proceed.  CC: 57 year old female being seen today for 55-month follow-up adult ADD. A/P as of last visit: "#1 adult ADD, not well controlled on Adderall 20 mg 3 times daily. Vyvanse very effective for her but too costly. We will do trial of Adderall XR 30 mg a day with an Adderall 20 mg in the late afternoon. #2 chronic anxiety.  Continue clonazepam and Effexor XR."  INTERIM HX: Jamie Tucker says things are going very well.  Pt states all is going well with the med at current dosing (Adderall XR 30 mg every morning and Adderall IR/short acting 20 mg q. Afternoon): much improved focus, concentration, task completion.  Less frustration, better multitasking, less impulsivity and restlessness.  Mood is stable.  Says this combination with her Effexor XR has resulted in sustained mood stability better than any medication combination in the past for her. No side effects from the medication. Clonazepam 1 mg twice daily extremely helpful as well.   PMP AWARE reviewed today: most recent rx for Adderall XR 30 mg was filled 02/14/2022, # 30, rx by me. Most recent prescription for Adderall 20 mg plain was filled 02/12/2022, #30, prescription by me. Most recent clonazepam 1 mg prescription was filled 11/16/2021, #180, prescription by me. No red flags.  ROS: See pertinent positives and negatives per HPI.  Past Medical History:  Diagnosis Date   Abnormal mammogram of right breast    12/04/19->calcifications->f/u imaging "probably benign, rpt R br diag  mammo 6 mo".   Adult ADHD    Alcoholism ([place])    [facility] ICU admission 2007 for acute Alc intox,; also detox at [facility] 11/2005   Anxiety    Atypical chest pain 06/2009   Colon cancer screening    I-FOB neg 06/2020.   Depression    History of abnormal Pap smear 11/2006   ASC-US, High risk HPV negative.  Follow up paps normal.   History of ETT 06/2009   Normal/No ischemia   Low back pain    Pt reports L/S x-ray that showed deg arth in the past   Mild hyperlipidemia 12/2018   TLC   Tobacco dependence     Past Surgical History:  Procedure Laterality Date   BUNIONECTOMY  1997   Left foot   exercise treadmill test  06/2009   No ischemia   TRANSTHORACIC ECHOCARDIOGRAM  11/2009   Normal except grade I diastolic dysfunction     Current Outpatient Medications:    estradiol-norethindrone (ACTIVELLA) 1-0.5 MG tablet, Take 1 tablet by mouth daily., Disp: , Rfl:    fexofenadine (ALLEGRA) 180 MG tablet, Take 180 mg by mouth daily. itching, Disp: , Rfl:    venlafaxine XR (EFFEXOR-XR) 75 MG 24 hr capsule, TAKE 3 CAPSULES BY MOUTH DAILY., Disp: 270 capsule, Rfl: 1   amphetamine-dextroamphetamine (ADDERALL XR) 30 MG 24 hr capsule, 1 cap po qAM, Disp: 30 capsule, Rfl: 0   amphetamine-dextroamphetamine (ADDERALL) 20 MG tablet, 1 tab po q afternoon, Disp: 30 tablet, Rfl: 0   augmented betamethasone dipropionate (DIPROLENE-AF) 0.05 % cream, , Disp: , Rfl:    clonazePAM (  KLONOPIN) 1 MG tablet, TAKE 1 TAB BY MOUTH EVERY 12 HOURS AS NEEDED FOR INCREASED ANXIETY, Disp: 180 tablet, Rfl: 1   fluticasone (FLONASE) 50 MCG/ACT nasal spray, Place 2 sprays into both nostrils daily. (Patient not taking: Reported on 11/16/2021), Disp: , Rfl:   EXAM:  VITALS per patient if applicable:  GENERAL: alert, oriented, appears well and in no acute distress  HEENT: atraumatic, conjunttiva clear, no obvious abnormalities on inspection of external nose and ears  NECK: normal movements of the head and neck  LUNGS: on  inspection no signs of respiratory distress, breathing rate appears normal, no obvious gross SOB, gasping or wheezing  CV: no obvious cyanosis  MS: moves all visible extremities without noticeable abnormality  PSYCH/NEURO: pleasant and cooperative, no obvious depression or anxiety, speech and thought processing grossly intact  LABS: none today    Chemistry      Component Value Date/Time   NA 138 07/21/2020 1112   K 4.3 07/21/2020 1112   CL 105 07/21/2020 1112   CO2 25 07/21/2020 1112   BUN 15 07/21/2020 1112   CREATININE 0.70 07/21/2020 1112   CREATININE 0.62 10/02/2010 1507      Component Value Date/Time   CALCIUM 9.7 07/21/2020 1112   ALKPHOS 38 (L) 07/21/2020 1112   AST 12 07/21/2020 1112   ALT 9 07/21/2020 1112   BILITOT 0.3 07/21/2020 1112      ASSESSMENT AND PLAN:  Discussed the following assessment and plan:  #1 adult ADD. Doing well on Adderall XR 30 mg every morning and 20 mg IR q. afternoon. Continue same regimen.  2.  Current major depressive disorder with GAD. Has been stable long-term now on Effexor XR 225 mg daily and clonazepam 1 mg every 12 hours.  Clonazepam refill today, #180, refill x 1.   I discussed the assessment and treatment plan with the patient. The patient was provided an opportunity to ask questions and all were answered. The patient agreed with the plan and demonstrated an understanding of the instructions.   F/u: 6 months for CPE  Signed:  Crissie Sickles, MD           02/20/2022

## 2022-03-12 ENCOUNTER — Other Ambulatory Visit: Payer: Self-pay

## 2022-03-12 NOTE — Telephone Encounter (Signed)
Requesting: adderall 73m Contract: 01/10/21 UDS: 01/10/21 Last Visit: 02/20/22 Next Visit: 6 mo f/u cpe Last Refill: 02/20/22 (30,0)  Requesting: adderall 226mContract: 01/10/21 UDS: 01/10/21 Last Visit: 02/20/22 Next Visit: 6 mo f/u cpe Last Refill: 02/20/22 (30,0)  Please Advise. Med pending

## 2022-03-12 NOTE — Telephone Encounter (Signed)
[facility] refill request. [facility]  mphetamine-dextroamphetamine (ADDERALL XR) 30 MG 24 hr capsule   amphetamine-dextroamphetamine (ADDERALL) 20 MG tablet

## 2022-03-13 MED ORDER — AMPHETAMINE-DEXTROAMPHETAMINE 20 MG PO TABS: ORAL_TABLET | ORAL | 0 refills | Status: DC

## 2022-03-13 MED ORDER — AMPHETAMINE-DEXTROAMPHET ER 30 MG PO CP24: ORAL_CAPSULE | ORAL | 0 refills | Status: DC

## 2022-04-09 ENCOUNTER — Other Ambulatory Visit: Payer: Self-pay | Admitting: Family Medicine

## 2022-04-09 NOTE — Telephone Encounter (Signed)
Requesting: adderall '30mg'$  Contract: 01/10/21 UDS: 01/10/21 Last Visit: 02/20/22 Next Visit: 6 mo f/u cpe Last Refill: 03/13/22 (30,0)   Requesting: adderall '20mg'$  Contract: 01/10/21 UDS: 01/10/21 Last Visit: 02/20/22 Next Visit: 6 mo f/u cpe Last Refill: 03/13/22 (30,0)   Please Advise. Meds pending

## 2022-04-09 NOTE — Telephone Encounter (Signed)
Jamie Tucker is not currently out of medications, but like to call a few days ahead. She will need a refill on both Adderall medications the 20 mg and 30 MG XR

## 2022-04-10 MED ORDER — AMPHETAMINE-DEXTROAMPHET ER 30 MG PO CP24: ORAL_CAPSULE | ORAL | 0 refills | Status: DC

## 2022-04-10 MED ORDER — AMPHETAMINE-DEXTROAMPHETAMINE 20 MG PO TABS: ORAL_TABLET | ORAL | 0 refills | Status: DC

## 2022-04-10 NOTE — Telephone Encounter (Signed)
Pt advised refill sent. °

## 2022-04-29 DIAGNOSIS — Z419 Encounter for procedure for purposes other than remedying health state, unspecified: Secondary | ICD-10-CM | POA: Diagnosis not present

## 2022-05-07 ENCOUNTER — Other Ambulatory Visit: Payer: Self-pay | Admitting: Family Medicine

## 2022-05-07 NOTE — Telephone Encounter (Signed)
Pt needs refill on both Adderall medications XR 30 mg and 20 MG   amphetamine-dextroamphetamine (ADDERALL) 20 MG tablet  amphetamine-dextroamphetamine (ADDERALL XR) 30 MG 24   Pharmacy is confirmed and correct.

## 2022-05-07 NOTE — Telephone Encounter (Signed)
Requesting: Adderall 20mg  tab Contract: 01/10/21 UDS: 01/10/21 Last Visit: 02/20/22 Next Visit: 6 mo f/u not scheduled  Last Refill: 04/10/22 (30,0)  Requesting: Adderall 30mg  capsule Contract: 01/10/21 UDS: 01/10/21 Last Visit: 02/20/22 Next Visit: 6 mo f/u not scheduled Last Refill: 3/13//24 (30,0)  Please Advise. Meds pending

## 2022-05-08 ENCOUNTER — Encounter: Payer: Self-pay | Admitting: Family Medicine

## 2022-05-08 MED ORDER — AMPHETAMINE-DEXTROAMPHETAMINE 20 MG PO TABS: ORAL_TABLET | ORAL | 0 refills | Status: DC

## 2022-05-08 MED ORDER — AMPHETAMINE-DEXTROAMPHET ER 30 MG PO CP24: ORAL_CAPSULE | ORAL | 0 refills | Status: DC

## 2022-05-09 MED ORDER — AMPHETAMINE-DEXTROAMPHETAMINE 20 MG PO TABS: ORAL_TABLET | ORAL | 0 refills | Status: DC

## 2022-05-09 NOTE — Telephone Encounter (Signed)
OK, rx for adderall 20mg  sent.  I stated on the rx "ok to fill 05/09/22".

## 2022-05-14 ENCOUNTER — Telehealth: Payer: Self-pay | Admitting: Family Medicine

## 2022-05-14 DIAGNOSIS — Z1231 Encounter for screening mammogram for malignant neoplasm of breast: Secondary | ICD-10-CM

## 2022-05-14 DIAGNOSIS — R921 Mammographic calcification found on diagnostic imaging of breast: Secondary | ICD-10-CM

## 2022-05-14 NOTE — Telephone Encounter (Signed)
Yes, okay for order. Diagnostic bilateral. Diagnosis is right breast calcifications.

## 2022-05-14 NOTE — Telephone Encounter (Signed)
Pt called and asked if she can have an order sent for a diagnostic memmogram. She is requesting this be sent to [facility] in [place].

## 2022-05-14 NOTE — Telephone Encounter (Signed)
Pt's last appt was 1/24, she is currently overdue for mammogram. Last one 12/05/20.  Okay for order?

## 2022-05-17 DIAGNOSIS — N3000 Acute cystitis without hematuria: Secondary | ICD-10-CM | POA: Diagnosis not present

## 2022-05-17 DIAGNOSIS — R300 Dysuria: Secondary | ICD-10-CM | POA: Diagnosis not present

## 2022-05-17 DIAGNOSIS — R051 Acute cough: Secondary | ICD-10-CM | POA: Diagnosis not present

## 2022-05-17 DIAGNOSIS — Z6820 Body mass index (BMI) 20.0-20.9, adult: Secondary | ICD-10-CM | POA: Diagnosis not present

## 2022-05-17 DIAGNOSIS — R350 Frequency of micturition: Secondary | ICD-10-CM | POA: Diagnosis not present

## 2022-05-17 DIAGNOSIS — H66002 Acute suppurative otitis media without spontaneous rupture of ear drum, left ear: Secondary | ICD-10-CM | POA: Diagnosis not present

## 2022-05-20 ENCOUNTER — Encounter: Payer: Self-pay | Admitting: Family Medicine

## 2022-05-20 ENCOUNTER — Telehealth: Payer: Self-pay | Admitting: Family Medicine

## 2022-05-20 MED ORDER — LISDEXAMFETAMINE DIMESYLATE 70 MG PO CAPS: 70.0000 mg | ORAL_CAPSULE | Freq: Every day | ORAL | 0 refills | Status: DC

## 2022-05-20 NOTE — Telephone Encounter (Signed)
Okay, Vyvanse 70 mg prescription sent. Stop all Adderall. Needs follow-up in July/August timeframe.

## 2022-05-20 NOTE — Telephone Encounter (Signed)
Patient calls and reports that she has recently been approved for Medicaid, so with this she is asking that she be switched back to Vyvanse since Medicaid will cover this. She reports that the other medication is not helping her and she knows from previous experience Vyvanse helps her and helps with her depression as well.  [facility] [address]  Please let the patient know if this request can be honored.

## 2022-05-20 NOTE — Telephone Encounter (Signed)
Pt advised of med change.  

## 2022-05-20 NOTE — Telephone Encounter (Signed)
Please Advise

## 2022-05-21 ENCOUNTER — Telehealth: Payer: Self-pay

## 2022-05-21 NOTE — Telephone Encounter (Signed)
Jamie Tucker (Key: [id number]) [phone] Lisdexamfetamine Dimesylate  capsules Status: PA Request Created: April 22nd, 2024 [phone] Sent: April 23rd, 2024

## 2022-05-24 ENCOUNTER — Other Ambulatory Visit (HOSPITAL_COMMUNITY): Payer: Self-pay

## 2022-05-29 DIAGNOSIS — Z419 Encounter for procedure for purposes other than remedying health state, unspecified: Secondary | ICD-10-CM | POA: Diagnosis not present

## 2022-06-05 ENCOUNTER — Encounter: Payer: Self-pay | Admitting: Family Medicine

## 2022-06-06 ENCOUNTER — Telehealth: Payer: Self-pay

## 2022-06-06 DIAGNOSIS — R921 Mammographic calcification found on diagnostic imaging of breast: Secondary | ICD-10-CM

## 2022-06-06 MED ORDER — AMPHETAMINE-DEXTROAMPHETAMINE 20 MG PO TABS: 20.0000 mg | ORAL_TABLET | Freq: Two times a day (BID) | ORAL | 0 refills | Status: DC

## 2022-06-06 NOTE — Telephone Encounter (Signed)
Okay. Prescription for Adderall 20 mg 1 tab twice daily, #60. Do not take Vyvanse at the same time as this medication. Follow-up within 1 month.

## 2022-06-06 NOTE — Telephone Encounter (Signed)
New order needed for mammogram, diagnostic bilateral mammogram needed.

## 2022-06-16 ENCOUNTER — Other Ambulatory Visit: Payer: Self-pay | Admitting: Family Medicine

## 2022-06-17 MED ORDER — LISDEXAMFETAMINE DIMESYLATE 70 MG PO CAPS: 70.0000 mg | ORAL_CAPSULE | Freq: Every day | ORAL | 0 refills | Status: DC

## 2022-06-17 NOTE — Telephone Encounter (Signed)
Requesting:vyvanse Contract:n/a UDS:01/17/21 Last Visit:02/20/22 Next Visit: Last Refill:05/20/22 (30,0)  Please Advise

## 2022-06-18 ENCOUNTER — Encounter: Payer: Self-pay | Admitting: Family Medicine

## 2022-06-29 DIAGNOSIS — Z419 Encounter for procedure for purposes other than remedying health state, unspecified: Secondary | ICD-10-CM | POA: Diagnosis not present

## 2022-07-02 ENCOUNTER — Other Ambulatory Visit: Payer: Self-pay | Admitting: Family Medicine

## 2022-07-02 ENCOUNTER — Encounter: Payer: Self-pay | Admitting: Family Medicine

## 2022-07-02 NOTE — Telephone Encounter (Signed)
Refill requested for Adderall 20 mg. Pt does have a new PCP appt scheduled for 7/22

## 2022-07-03 NOTE — Telephone Encounter (Signed)
Pt requested refills from provider, message forwarded yesterday. Waiting for PCP to respond if meds can be refilled.

## 2022-07-05 MED ORDER — AMPHETAMINE-DEXTROAMPHETAMINE 20 MG PO TABS: 20.0000 mg | ORAL_TABLET | Freq: Two times a day (BID) | ORAL | 0 refills | Status: DC

## 2022-07-05 MED ORDER — AMPHETAMINE-DEXTROAMPHET ER 30 MG PO CP24
30.0000 mg | ORAL_CAPSULE | Freq: Every day | ORAL | 0 refills | Status: DC
Start: 2022-07-05 — End: 2022-07-11

## 2022-07-11 MED ORDER — AMPHETAMINE-DEXTROAMPHET ER 30 MG PO CP24: 30.0000 mg | ORAL_CAPSULE | Freq: Every day | ORAL | 0 refills | Status: DC

## 2022-07-11 NOTE — Telephone Encounter (Signed)
Please resend to pharmacy, med pending

## 2022-07-17 ENCOUNTER — Telehealth (INDEPENDENT_AMBULATORY_CARE_PROVIDER_SITE_OTHER): Payer: Medicaid Other | Admitting: Family Medicine

## 2022-07-17 ENCOUNTER — Encounter: Payer: Self-pay | Admitting: Family Medicine

## 2022-07-17 VITALS — Ht 62.0 in | Wt 108.0 lb

## 2022-07-17 DIAGNOSIS — F988 Other specified behavioral and emotional disorders with onset usually occurring in childhood and adolescence: Secondary | ICD-10-CM

## 2022-07-17 DIAGNOSIS — F3342 Major depressive disorder, recurrent, in full remission: Secondary | ICD-10-CM

## 2022-07-17 DIAGNOSIS — Z79899 Other long term (current) drug therapy: Secondary | ICD-10-CM | POA: Diagnosis not present

## 2022-07-17 DIAGNOSIS — F411 Generalized anxiety disorder: Secondary | ICD-10-CM | POA: Diagnosis not present

## 2022-07-17 MED ORDER — LISDEXAMFETAMINE DIMESYLATE 70 MG PO CAPS: 70.0000 mg | ORAL_CAPSULE | Freq: Every day | ORAL | 0 refills | Status: DC

## 2022-07-17 MED ORDER — VENLAFAXINE HCL ER 75 MG PO CP24: 225.0000 mg | ORAL_CAPSULE | Freq: Every day | ORAL | 1 refills | Status: DC

## 2022-07-17 NOTE — Progress Notes (Signed)
Virtual Visit via Video Note  I connected with Jamie Tucker  on 07/17/22 at  1:40 PM EDT by a video enabled telemedicine application and verified that I am speaking with the correct person using two identifiers.  Location patient: [place] Location provider:work or home office Persons participating in the virtual visit: patient, provider  I discussed the limitations and requested verbal permission for telemedicine visit. The patient expressed understanding and agreed to proceed.  CC: f/u add 57 year old female being seen today for 2-month follow-up adult ADD, GAD, and recurrent major depressive disorder. A/P as of last visit: "#1 adult ADD. Doing well on Adderall XR 30 mg every morning and 20 mg IR q. afternoon. Continue same regimen.   2.  Recurrent major depressive disorder with GAD. Has been stable long-term now on Effexor XR 225 mg daily and clonazepam 1 mg every 12 hours.  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 She is going to job interviews and doing some [employer] to earn money. She feels like she is coping with this pretty well and does not feel like she is gone into a episode of severe anxiety/panic nor has she had any significant prolonged depression. She continues to attend AA.  Pt states all is going well with the med at current dosing: much improved focus, concentration, task completion.  Less frustration, better multitasking, less impulsivity and restlessness.  Mood is stable. No side effects from the medication. She takes Vyvanse on some days when she gets up early and knows that it will not interfere with sleep. On other days she may delay taking medication until later in the day and on these days avoids Vyvanse completely and takes either an Adderall XR or a plain Adderall. She states clearly that she does not use Vyvanse and Adderall together.  PMP AWARE reviewed today: most recent rx for Adderall 20 mg was  filled 07/04/2022, # 60, rx by me. Most recent clonazepam prescription filled 06/21/2022, #60, prescription by me. Most recent Vyvanse 70 mg prescription filled 06/17/2022, #30, prescription by me. Most recent Adderall XR 30 mg capsule prescription filled 07/11/2022, #30, prescription by me. No red flags.   ROS: See pertinent positives and negatives per HPI.  Past Medical History:  Diagnosis Date   Abnormal mammogram of right breast    12/04/19->calcifications->f/u imaging "probably benign, rpt R br diag mammo 6 mo".   Adult ADHD    Alcoholism ([facility])    [facility] ICU admission 2007 for acute Alc intox,; also detox at [facility] 11/2005   Anxiety    Atypical chest pain 06/2009   Colon cancer screening    I-FOB neg 06/2020.   Depression    History of abnormal Pap smear 11/2006   ASC-US, High risk HPV negative.  Follow up paps normal.   History of ETT 06/2009   Normal/No ischemia   Low back pain    Pt reports L/S x-ray that showed deg arth in the past   Mild hyperlipidemia 12/2018   TLC   Tobacco dependence     Past Surgical History:  Procedure Laterality Date   BUNIONECTOMY  1997   Left foot   exercise treadmill test  06/2009   No ischemia   TRANSTHORACIC ECHOCARDIOGRAM  11/2009   Normal except grade I diastolic dysfunction     Current Outpatient Medications:    amphetamine-dextroamphetamine (ADDERALL XR) 30 MG 24 hr capsule, Take 1 capsule (30 mg total) by mouth daily., Disp:  30 capsule, Rfl: 0   amphetamine-dextroamphetamine (ADDERALL) 20 MG tablet, Take 1 tablet (20 mg total) by mouth 2 (two) times daily., Disp: 60 tablet, Rfl: 0   clonazePAM (KLONOPIN) 1 MG tablet, TAKE 1 TAB BY MOUTH EVERY 12 HOURS AS NEEDED FOR INCREASED ANXIETY, Disp: 180 tablet, Rfl: 1   estradiol-norethindrone (ACTIVELLA) 1-0.5 MG tablet, Take 1 tablet by mouth daily., Disp: , Rfl:    fexofenadine (ALLEGRA) 180 MG tablet, Take 180 mg by mouth daily. itching, Disp: , Rfl:    lisdexamfetamine (VYVANSE) 70 MG capsule,  Take 1 capsule (70 mg total) by mouth daily., Disp: 30 capsule, Rfl: 0   venlafaxine XR (EFFEXOR-XR) 75 MG 24 hr capsule, TAKE 3 CAPSULES BY MOUTH DAILY., Disp: 270 capsule, Rfl: 1   augmented betamethasone dipropionate (DIPROLENE-AF) 0.05 % cream, , Disp: , Rfl:    fluticasone (FLONASE) 50 MCG/ACT nasal spray, Place 2 sprays into both nostrils daily. (Patient not taking: Reported on 11/16/2021), Disp: , Rfl:   EXAM:  VITALS per patient if applicable:     07/17/2022    1:38 PM 11/16/2021   10:21 AM 08/22/2021    8:26 AM  Vitals with BMI  Height 5\' 2"  5' 2.25" 5' 2.25"  Weight 108 lbs 112 lbs 112 lbs  BMI 19.75 20.32 20.32     GENERAL: alert, oriented, appears well and in no acute distress  HEENT: atraumatic, conjunttiva clear, no obvious abnormalities on inspection of external nose and ears  NECK: normal movements of the head and neck  LUNGS: on inspection no signs of respiratory distress, breathing rate appears normal, no obvious gross SOB, gasping or wheezing  CV: no obvious cyanosis  MS: moves all visible extremities without noticeable abnormality  PSYCH/NEURO: pleasant and cooperative, no obvious depression or anxiety, speech and thought processing grossly intact  LABS: none today    Chemistry      Component Value Date/Time   NA 138 07/21/2020 1112   K 4.3 07/21/2020 1112   CL 105 07/21/2020 1112   CO2 25 07/21/2020 1112   BUN 15 07/21/2020 1112   CREATININE 0.70 07/21/2020 1112   CREATININE 0.62 10/02/2010 1507      Component Value Date/Time   CALCIUM 9.7 07/21/2020 1112   ALKPHOS 38 (L) 07/21/2020 1112   AST 12 07/21/2020 1112   ALT 9 07/21/2020 1112   BILITOT 0.3 07/21/2020 1112     ASSESSMENT AND PLAN:  Discussed the following assessment and plan:  #1 adult ADD. Stable. Current regimen varies depending on when she gets up and takes her medication: Vyvanse 70 mg in the morning if she gets up early.  Other days if she gets up later she will take an  Adderall XR but some days only Adderall plain. Review of online controlled substance prescribing database shows no red flags.  She is due for refill of her Vyvanse and I sent in #30 of the 70 mg tabs today.  2 GAD, history of recurrent major depressive disorder. She is doing well, especially considering life circumstances right now--she lost her job and is currently searching for another. Continue venlafaxine XR, 3 of the 75 mg tabs daily as well as clonazepam 1 mg twice daily as needed. Venlafaxine refilled today but she did not need a prescription for clonazepam at this time.   I discussed the assessment and treatment plan with the patient. The patient was provided an opportunity to ask questions and all were answered. The patient agreed with the plan and demonstrated  an understanding of the instructions.   F/u: 6 months, must be in person encounter  Signed:  Santiago Bumpers, MD           07/17/2022

## 2022-07-20 DIAGNOSIS — N3001 Acute cystitis with hematuria: Secondary | ICD-10-CM | POA: Diagnosis not present

## 2022-07-20 DIAGNOSIS — R350 Frequency of micturition: Secondary | ICD-10-CM | POA: Diagnosis not present

## 2022-07-20 DIAGNOSIS — J0100 Acute maxillary sinusitis, unspecified: Secondary | ICD-10-CM | POA: Diagnosis not present

## 2022-07-20 DIAGNOSIS — B3731 Acute candidiasis of vulva and vagina: Secondary | ICD-10-CM | POA: Diagnosis not present

## 2022-07-20 DIAGNOSIS — Z681 Body mass index (BMI) 19 or less, adult: Secondary | ICD-10-CM | POA: Diagnosis not present

## 2022-07-29 DIAGNOSIS — Z419 Encounter for procedure for purposes other than remedying health state, unspecified: Secondary | ICD-10-CM | POA: Diagnosis not present

## 2022-07-31 DIAGNOSIS — N898 Other specified noninflammatory disorders of vagina: Secondary | ICD-10-CM | POA: Diagnosis not present

## 2022-07-31 DIAGNOSIS — R051 Acute cough: Secondary | ICD-10-CM | POA: Diagnosis not present

## 2022-07-31 DIAGNOSIS — Z681 Body mass index (BMI) 19 or less, adult: Secondary | ICD-10-CM | POA: Diagnosis not present

## 2022-07-31 DIAGNOSIS — N760 Acute vaginitis: Secondary | ICD-10-CM | POA: Diagnosis not present

## 2022-07-31 DIAGNOSIS — J0100 Acute maxillary sinusitis, unspecified: Secondary | ICD-10-CM | POA: Diagnosis not present

## 2022-07-31 DIAGNOSIS — U071 COVID-19: Secondary | ICD-10-CM | POA: Diagnosis not present

## 2022-08-13 ENCOUNTER — Other Ambulatory Visit: Payer: Self-pay | Admitting: Family Medicine

## 2022-08-14 ENCOUNTER — Telehealth: Payer: Self-pay | Admitting: Family Medicine

## 2022-08-14 ENCOUNTER — Other Ambulatory Visit: Payer: Self-pay | Admitting: Family Medicine

## 2022-08-14 MED ORDER — LISDEXAMFETAMINE DIMESYLATE 70 MG PO CAPS: 70.0000 mg | ORAL_CAPSULE | Freq: Every day | ORAL | 0 refills | Status: DC

## 2022-08-14 NOTE — Telephone Encounter (Signed)
Patient reports that her usual pharmacy is out of lisdexamfetamine (VYVANSE) 70 MG capsule She called around other pharmacies [facility] [address] [facility] [place] [facility] the brand Vyvanse in stock, but she unsure of the generic or if her insurance will cover Vyvanse. She also asked if another generic can maybe be called in other than Lisexamfetamine.

## 2022-08-14 NOTE — Telephone Encounter (Signed)
Med refill sent to PCP.

## 2022-08-19 ENCOUNTER — Ambulatory Visit: Payer: Medicaid Other | Admitting: Family Medicine

## 2022-08-26 ENCOUNTER — Other Ambulatory Visit: Payer: Self-pay | Admitting: Family Medicine

## 2022-08-26 MED ORDER — AMPHETAMINE-DEXTROAMPHETAMINE 20 MG PO TABS: 20.0000 mg | ORAL_TABLET | Freq: Two times a day (BID) | ORAL | 0 refills | Status: DC

## 2022-08-26 NOTE — Telephone Encounter (Signed)
Pt called and want [facility] [address] She was advised thay she is to schedule in person OV but stated she will call to schedule once she checks her work schedule

## 2022-08-28 ENCOUNTER — Telehealth: Payer: Self-pay | Admitting: Family Medicine

## 2022-08-28 ENCOUNTER — Other Ambulatory Visit: Payer: Self-pay | Admitting: Family Medicine

## 2022-08-28 MED ORDER — AMPHETAMINE-DEXTROAMPHETAMINE 20 MG PO TABS: 20.0000 mg | ORAL_TABLET | Freq: Two times a day (BID) | ORAL | 0 refills | Status: DC

## 2022-08-28 NOTE — Telephone Encounter (Signed)
Okay, prescription sent again

## 2022-08-28 NOTE — Telephone Encounter (Signed)
Please call patient and verify her refill request. The initial refill request (7/29) was for 20 mg Adderall.  I [facility] [address] at that time.  Then yesterday she sent another message stating either the prescription was the wrong medicine or went to a pharmacy that does not have that med.  The message is not clear to me.  She is on 2 kinds of Adderall: The Adderall XR 30 mg was last filled 08/08/2022 so this 1 is too early for refill.  The Adderall 20 mg, which she takes twice a day was last filled on 7/3.  I could send in a renewal prescription for this 1.  Essentially I need to know which Adderall she is requesting right now (and which pharmacy) and then I can determine if she is okay for refill. Thanks

## 2022-08-28 NOTE — Telephone Encounter (Signed)
When I spoke to pt she stated it was the Adderall 20mg  that was needed. She did [facility] [address]

## 2022-08-29 DIAGNOSIS — Z419 Encounter for procedure for purposes other than remedying health state, unspecified: Secondary | ICD-10-CM | POA: Diagnosis not present

## 2022-09-02 ENCOUNTER — Other Ambulatory Visit: Payer: Self-pay | Admitting: Family Medicine

## 2022-09-02 MED ORDER — CLONAZEPAM 1 MG PO TABS: ORAL_TABLET | ORAL | 0 refills | Status: DC

## 2022-09-02 NOTE — Telephone Encounter (Signed)
Requesting: clonazePAM (KLONOPIN) 1 MG tablet  Contract: 01/10/21 UDS: 01/10/21 Last Visit: 07/17/22 Next Visit: 6 mo f/u in office; not scheduled Last Refill: 02/20/22 (180,1)  Please Advise. Med pending

## 2022-09-02 NOTE — Telephone Encounter (Signed)
Prescription Request  09/02/2022  LOV: Visit date not found  What is the name of the medication or equipment?   clonazePAM (KLONOPIN) 1 MG tablet    Have you contacted your pharmacy to request a refill? Yes  [facility]   Which pharmacy would you like this sent to?  [id number] [place], [place] - [address] AT [facility] [address] [place] [address] Phone: [phone] Fax: [phone]  [facility] [place], [place] - [address] [place] [address] Phone: [phone] Fax: [phone]  [id number] [place] - [address] [place] [address] Phone: [phone] Fax: [phone]    Patient notified that their request is being sent to the clinical staff for review and that they should receive a response within 2 business days.   Please advise at Mobile [phone] (mobile)

## 2022-09-06 ENCOUNTER — Other Ambulatory Visit: Payer: Self-pay | Admitting: Family Medicine

## 2022-09-06 ENCOUNTER — Other Ambulatory Visit: Payer: Self-pay

## 2022-09-06 NOTE — Telephone Encounter (Signed)
Prescription Request  09/06/2022  LOV: Visit date not found  What is the name of the medication or equipment? amphetamine-dextroamphetamine (ADDERALL XR) 30 MG 24 hr capsule   Have you contacted your pharmacy to request a refill? No   Which pharmacy would you like this sent to?  [id number] [place], [place] - [address] AT [facility] [address] [place] [address] Phone: [phone] Fax: [phone]  [facility] [place], [place] - [address] [place] [address] Phone: [phone] Fax: [phone]  [id number] [place] [facility] - [address] [place] [address] Phone: [phone] Fax: [phone]    Patient notified that their request is being sent to the clinical staff for review and that they should receive a response within 2 business days.   Please advise at [facility] [phone]

## 2022-09-06 NOTE — Addendum Note (Signed)
Addended by: Emi Holes D on: 09/06/2022 03:29 PM   Modules accepted: Orders

## 2022-09-06 NOTE — Telephone Encounter (Signed)
Requesting: Adderall 30mg  Contract: 01/10/21 UDS: 01/10/21 Last Visit: 07/17/22 Next Visit: 6 mo f/u in office; not scheduled Last Refill: 07/11/22 (30,0)  Please Advise. Med pending

## 2022-09-09 ENCOUNTER — Telehealth: Payer: Self-pay

## 2022-09-09 NOTE — Telephone Encounter (Signed)
Prescription Request  09/09/2022  LOV: Visit date not found  What is the name of the medication or equipment? amphetamine-dextroamphetamine (ADDERALL XR) 30 MG 24 hr capsule   Have you contacted your pharmacy to request a refill? No   Which pharmacy would you like this sent to?  [id number] [place], [place] - [address] AT [facility] [address] [place] [address] Phone: [phone] Fax: [phone]  [facility] [place], [place] - [address] [place] [address] Phone: [phone] Fax: [phone]  [id number] [place] [facility] - [address] [place] [address] Phone: [phone] Fax: [phone]    Patient notified that their request is being sent to the clinical staff for review and that they should receive a response within 2 business days.   Please advise at [facility] [phone]

## 2022-09-10 ENCOUNTER — Telehealth: Payer: Self-pay

## 2022-09-10 MED ORDER — AMPHETAMINE-DEXTROAMPHET ER 30 MG PO CP24: 30.0000 mg | ORAL_CAPSULE | Freq: Every day | ORAL | 0 refills | Status: DC

## 2022-09-10 NOTE — Telephone Encounter (Signed)
Message has already been sent to provider for medication refill review

## 2022-09-10 NOTE — Telephone Encounter (Signed)
Please fill, if appropriate.  

## 2022-09-10 NOTE — Telephone Encounter (Signed)
Patient's  disability forms  were faxed to our office to be filled out by provider. Patient requested to send it back via Fax within 7-days. Document is located in providers tray at front office.Please advise at Mobile [phone] (mobile)    Placed on PCP desk to review and sign, if appropriate.

## 2022-09-14 DIAGNOSIS — N3000 Acute cystitis without hematuria: Secondary | ICD-10-CM | POA: Diagnosis not present

## 2022-09-14 DIAGNOSIS — Z23 Encounter for immunization: Secondary | ICD-10-CM | POA: Diagnosis not present

## 2022-09-14 DIAGNOSIS — Z681 Body mass index (BMI) 19 or less, adult: Secondary | ICD-10-CM | POA: Diagnosis not present

## 2022-09-15 ENCOUNTER — Other Ambulatory Visit: Payer: Self-pay | Admitting: Family Medicine

## 2022-09-16 MED ORDER — LISDEXAMFETAMINE DIMESYLATE 70 MG PO CAPS: 70.0000 mg | ORAL_CAPSULE | Freq: Every day | ORAL | 0 refills | Status: DC

## 2022-09-16 NOTE — Telephone Encounter (Signed)
Additional information. She is not taking the adderall XR right now due to dryness in combination with her allergy medication. She is still needing the refill as stated from the previous message.

## 2022-09-17 ENCOUNTER — Ambulatory Visit: Payer: Medicaid Other | Admitting: Family Medicine

## 2022-09-17 NOTE — Progress Notes (Deleted)
OFFICE VISIT  09/17/2022  CC: No chief complaint on file.   Patient is a 57 y.o. female who presents for follow-up adult ADD and anxiety.  Has some forms to discuss as well.  INTERIM HX: ***  Past Medical History:  Diagnosis Date   Abnormal mammogram of right breast    12/04/19->calcifications->f/u imaging "probably benign, rpt R br diag mammo 6 mo".   Adult ADHD    Alcoholism (HCC)    [facility] ICU admission 2007 for acute Alc intox,; also detox at [facility] 11/2005   Anxiety    Atypical chest pain 06/2009   Colon cancer screening    I-FOB neg 06/2020.   Depression    History of abnormal Pap smear 11/2006   ASC-US, High risk HPV negative.  Follow up paps normal.   History of ETT 06/2009   Normal/No ischemia   Low back pain    Pt reports L/S x-ray that showed deg arth in the past   Mild hyperlipidemia 12/2018   TLC   Tobacco dependence     Past Surgical History:  Procedure Laterality Date   BUNIONECTOMY  1997   Left foot   exercise treadmill test  06/2009   No ischemia   TRANSTHORACIC ECHOCARDIOGRAM  11/2009   Normal except grade I diastolic dysfunction    Outpatient Medications Prior to Visit  Medication Sig Dispense Refill   amphetamine-dextroamphetamine (ADDERALL XR) 30 MG 24 hr capsule Take 1 capsule (30 mg total) by mouth daily. 30 capsule 0   amphetamine-dextroamphetamine (ADDERALL) 20 MG tablet Take 1 tablet (20 mg total) by mouth 2 (two) times daily. 60 tablet 0   augmented betamethasone dipropionate (DIPROLENE-AF) 0.05 % cream  (Patient not taking: Reported on 11/16/2021)     clonazePAM (KLONOPIN) 1 MG tablet TAKE 1 TAB BY MOUTH EVERY 12 HOURS AS NEEDED FOR INCREASED ANXIETY 180 tablet 0   estradiol-norethindrone (ACTIVELLA) 1-0.5 MG tablet Take 1 tablet by mouth daily.     fexofenadine (ALLEGRA) 180 MG tablet Take 180 mg by mouth daily. itching     fluticasone (FLONASE) 50 MCG/ACT nasal spray Place 2 sprays into both nostrils daily. (Patient not taking: Reported on  11/16/2021)     lisdexamfetamine (VYVANSE) 70 MG capsule Take 1 capsule (70 mg total) by mouth daily. 30 capsule 0   venlafaxine XR (EFFEXOR-XR) 75 MG 24 hr capsule Take 3 capsules (225 mg total) by mouth daily. 270 capsule 1   No facility-administered medications prior to visit.    No Known Allergies  Review of Systems As per HPI  PE:    07/17/2022    1:38 PM 11/16/2021   10:21 AM 08/22/2021    8:26 AM  Vitals with BMI  Height 5\' 2"  5' 2.25" 5' 2.25"  Weight 108 lbs 112 lbs 112 lbs  BMI 19.75 20.32 20.32     Physical Exam  ***  LABS:  Last CBC Lab Results  Component Value Date   WBC 6.0 07/21/2020   HGB 12.2 07/21/2020   HCT 36.5 07/21/2020   MCV 90.7 07/21/2020   MCH 29.9 10/02/2010   RDW 13.0 07/21/2020   PLT 318.0 07/21/2020   Last metabolic panel Lab Results  Component Value Date   GLUCOSE 82 07/21/2020   NA 138 07/21/2020   K 4.3 07/21/2020   CL 105 07/21/2020   CO2 25 07/21/2020   BUN 15 07/21/2020   CREATININE 0.70 07/21/2020   GFR 98.45 07/21/2020   CALCIUM 9.7 07/21/2020   PROT 6.7 07/21/2020  ALBUMIN 4.4 07/21/2020   BILITOT 0.3 07/21/2020   ALKPHOS 38 (L) 07/21/2020   AST 12 07/21/2020   ALT 9 07/21/2020   IMPRESSION AND PLAN:  No problem-specific Assessment & Plan notes found for this encounter.   An After Visit Summary was printed and given to the patient.  FOLLOW UP: No follow-ups on file.  Signed:  Santiago Bumpers, MD           09/17/2022

## 2022-09-25 ENCOUNTER — Other Ambulatory Visit: Payer: Self-pay | Admitting: Family Medicine

## 2022-09-29 DIAGNOSIS — Z419 Encounter for procedure for purposes other than remedying health state, unspecified: Secondary | ICD-10-CM | POA: Diagnosis not present

## 2022-10-08 ENCOUNTER — Other Ambulatory Visit: Payer: Self-pay | Admitting: Family Medicine

## 2022-10-08 MED ORDER — AMPHETAMINE-DEXTROAMPHET ER 30 MG PO CP24: 30.0000 mg | ORAL_CAPSULE | Freq: Every day | ORAL | 0 refills | Status: DC

## 2022-10-08 NOTE — Telephone Encounter (Signed)
Requesting: amphetamine-dextroamphetamine (ADDERALL XR) 30 MG 24 hr capsule  Contract: 01/10/21 UDS: 01/10/21 Last Visit: 07/17/22 Next Visit: 10/11/22 Last Refill: 09/10/22 (30,0)  Please Advise. Med pending

## 2022-10-09 NOTE — Patient Instructions (Addendum)
   It was very nice to see you today!   Community Pharmacy Locations offering vaccines:   [facility]or, quantity [facility] [place]   10 vaccines are offered at the [facility]:  Covid, flu, Tdap, shingles, RSV, pneumonia, meningococcal, hepatitis A, hepatitis B, and HPV.  Patients must be eligible for vaccines according to the CDC/ACIP guidelines.   PLEASE NOTE:  If labs were collected or images ordered, we will inform you of  results once we have received them and reviewed. We will contact you either by echart message, or telephone call.     Please give ample time to the testing facility, and our office to run, receive and review results. Please do not call inquiring of results, even if you can see them in your chart. We will contact you as soon as we are able. If it has been over 1 week since the test was completed, and you have not yet heard from [place], then please call us.   If we ordered any referrals today, please let us know if you have not heard from their office within the next 2 weeks. You should receive a letter via MyChart confirming if the referral was approved and their office contact information to schedule.

## 2022-10-09 NOTE — Telephone Encounter (Signed)
Pt advised refill sent. °

## 2022-10-11 ENCOUNTER — Encounter: Payer: Self-pay | Admitting: Family Medicine

## 2022-10-11 ENCOUNTER — Ambulatory Visit (INDEPENDENT_AMBULATORY_CARE_PROVIDER_SITE_OTHER): Payer: Medicaid Other | Admitting: Family Medicine

## 2022-10-11 VITALS — BP 110/74 | HR 73 | Temp 98.3°F | Ht 62.0 in | Wt 105.0 lb

## 2022-10-11 DIAGNOSIS — F988 Other specified behavioral and emotional disorders with onset usually occurring in childhood and adolescence: Secondary | ICD-10-CM | POA: Diagnosis not present

## 2022-10-11 DIAGNOSIS — Z79899 Other long term (current) drug therapy: Secondary | ICD-10-CM | POA: Diagnosis not present

## 2022-10-11 DIAGNOSIS — F411 Generalized anxiety disorder: Secondary | ICD-10-CM

## 2022-10-11 DIAGNOSIS — F332 Major depressive disorder, recurrent severe without psychotic features: Secondary | ICD-10-CM | POA: Diagnosis not present

## 2022-10-11 MED ORDER — AMPHETAMINE-DEXTROAMPHETAMINE 20 MG PO TABS: 20.0000 mg | ORAL_TABLET | Freq: Two times a day (BID) | ORAL | 0 refills | Status: DC

## 2022-10-11 MED ORDER — VENLAFAXINE HCL 37.5 MG PO TABS: ORAL_TABLET | ORAL | 0 refills | Status: DC

## 2022-10-11 MED ORDER — FLUOXETINE HCL 20 MG PO CAPS: 20.0000 mg | ORAL_CAPSULE | Freq: Every day | ORAL | 1 refills | Status: DC

## 2022-10-11 MED ORDER — LISDEXAMFETAMINE DIMESYLATE 70 MG PO CAPS: 70.0000 mg | ORAL_CAPSULE | Freq: Every day | ORAL | 0 refills | Status: DC

## 2022-10-11 NOTE — Progress Notes (Signed)
OFFICE VISIT  10/27/2022  CC:  Chief Complaint  Patient presents with   Depression    Follow up    Patient is a 57 y.o. female who presents for depression, anxiety, and adult ADD. I last saw her 07/17/2022, virtual visit. A/P as of that visit: "#1 adult ADD. Stable. Current regimen varies depending on when she gets up and takes her medication: Vyvanse 70 mg in the morning if she gets up early.  Other days if she gets up later she will take an Adderall XR but some days only Adderall plain. Review of online controlled substance prescribing database shows no red flags.  She is due for refill of her Vyvanse and I sent in #30 of the 70 mg tabs today.   2 GAD, history of recurrent major depressive disorder. She is doing well, especially considering life circumstances right now--she lost her job and is currently searching for another. Continue venlafaxine XR, 3 of the 75 mg tabs daily as well as clonazepam 1 mg twice daily as needed. Venlafaxine refilled today but she did not need a prescription for clonazepam at this time."  INTERIM HX: Bad depression x 8-9 mo, lost her job earlier this year. Has not been drinking, though.  Denies SI or HI. Appetite poor, has lost 12 lb in the last 15 mo.  Says her stimulant med help great for her focus but also for her depression, although she still struggles every day just to get out of bed and function. She takes Vyvanse on some days when she gets up early and knows that it will not interfere with sleep. On other days she may delay taking medication until later in the day and on these days avoids Vyvanse completely and takes either an Adderall XR or a plain Adderall. She states clearly that she does not use Vyvanse and Adderall together.   PMP AWARE reviewed today: most recent rx for Adderall XR 30 mg was filled 10/08/2022, # 28, rx by me. Most recent prescription for clonazepam was filled 09/30/2022, #60, prescription by me. Most recent prescription for  Adderall plain 20 mg tabs was filled 09/23/2022, #60, prescription by me. Most recent Vyvanse 70 mg prescription filled 09/16/2022, #30, prescription by me. No red flags.  ROS: no halluc/delusions, no manic sx's, no CP, no palp's, no rash, no tremor.  Past Medical History:  Diagnosis Date   Abnormal mammogram of right breast    12/04/19->calcifications->f/u imaging "probably benign, rpt R br diag mammo 6 mo".   Adult ADHD    Alcoholism (HCC)    [facility] ICU admission 2007 for acute Alc intox,; also detox at [facility] 11/2005   Anxiety    Atypical chest pain 06/2009   Colon cancer screening    I-FOB neg 06/2020.   Depression    History of abnormal Pap smear 11/2006   ASC-US, High risk HPV negative.  Follow up paps normal.   History of ETT 06/2009   Normal/No ischemia   Low back pain    Pt reports L/S x-ray that showed deg arth in the past   Mild hyperlipidemia 12/2018   TLC   Tobacco dependence     Past Surgical History:  Procedure Laterality Date   BUNIONECTOMY  1997   Left foot   exercise treadmill test  06/2009   No ischemia   TRANSTHORACIC ECHOCARDIOGRAM  11/2009   Normal except grade I diastolic dysfunction    Outpatient Medications Prior to Visit  Medication Sig Dispense Refill   amphetamine-dextroamphetamine (  ADDERALL XR) 30 MG 24 hr capsule Take 1 capsule (30 mg total) by mouth daily. 30 capsule 0   clonazePAM (KLONOPIN) 1 MG tablet TAKE 1 TAB BY MOUTH EVERY 12 HOURS AS NEEDED FOR INCREASED ANXIETY 180 tablet 0   estradiol-norethindrone (ACTIVELLA) 1-0.5 MG tablet Take 1 tablet by mouth daily.     fexofenadine (ALLEGRA) 180 MG tablet Take 180 mg by mouth daily. itching     venlafaxine XR (EFFEXOR-XR) 75 MG 24 hr capsule Take 3 capsules (225 mg total) by mouth daily. 270 capsule 1   augmented betamethasone dipropionate (DIPROLENE-AF) 0.05 % cream  (Patient not taking: Reported on 11/16/2021)     fluticasone (FLONASE) 50 MCG/ACT nasal spray Place 2 sprays into both nostrils daily.  (Patient not taking: Reported on 11/16/2021)     amphetamine-dextroamphetamine (ADDERALL) 20 MG tablet Take 1 tablet (20 mg total) by mouth 2 (two) times daily. 60 tablet 0   lisdexamfetamine (VYVANSE) 70 MG capsule Take 1 capsule (70 mg total) by mouth daily. 30 capsule 0   No facility-administered medications prior to visit.    No Known Allergies  Review of Systems As per HPI  PE:    10/11/2022    1:46 PM 07/17/2022    1:38 PM 11/16/2021   10:21 AM  Vitals with BMI  Height 5\' 2"  5\' 2"  5' 2.25"  Weight 105 lbs 108 lbs 112 lbs  BMI 19.2 19.75 20.32  Systolic 110    Diastolic 74    Pulse 73       Physical Exam  Gen: Alert, well appearing.  Patient is oriented to person, place, time, and situation. AFFECT: pleasant, lucid thought and speech.   LABS:  Last thyroid functions Lab Results  Component Value Date   TSH 1.99 07/21/2020     Chemistry      Component Value Date/Time   NA 138 07/21/2020 1112   K 4.3 07/21/2020 1112   CL 105 07/21/2020 1112   CO2 25 07/21/2020 1112   BUN 15 07/21/2020 1112   CREATININE 0.70 07/21/2020 1112   CREATININE 0.62 10/02/2010 1507      Component Value Date/Time   CALCIUM 9.7 07/21/2020 1112   ALKPHOS 38 (L) 07/21/2020 1112   AST 12 07/21/2020 1112   ALT 9 07/21/2020 1112   BILITOT 0.3 07/21/2020 1112     Lab Results  Component Value Date   WBC 6.0 07/21/2020   HGB 12.2 07/21/2020   HCT 36.5 07/21/2020   MCV 90.7 07/21/2020   PLT 318.0 07/21/2020   IMPRESSION AND PLAN:  #1 adult ADD. Stable. Current regimen varies depending on when she gets up and takes her medication: Vyvanse 70 mg in the morning if she gets up early.  Other days if she gets up later she will take an Adderall XR but some days only Adderall plain. Review of online controlled substance prescribing database shows no red flags.  She is due for refill of her Vyvanse and I sent in #30 of the 70 mg tabs today. Her stimulants also serve as augmentation to her  antidepressant regimen.   2 GAD, active recurrent major depressive disorder. Very difficult life circumstances right now--she lost her job and is currently searching for another. She wants to try prozac b/c she recalls it helping her in remote past, although she doesn't recall why she got off of it. Will start prozac 20 every day.  At the same time, start ween off venlafaxine: 37.5 tid x 5d then 37.5  bid x 5d then 37.5 once a day x 5d, then stop. Will refer to counseling that medicaid will cover.  She will research this and get back to me about referral.  An After Visit Summary was printed and given to the patient.  FOLLOW UP: Return in about 4 weeks (around 11/08/2022) for routine chronic illness f/u.  Signed:  Santiago Bumpers, MD           10/27/2022

## 2022-10-13 LAB — DRUG MONITORING PANEL 376104, URINE
Amphetamine: >15000 ng/mL — ABNORMAL HIGH (ref <250)
Amphetamines: POSITIVE ng/mL — AB (ref <500)
Barbiturates: NEGATIVE ng/mL (ref <300)
Benzodiazepines: NEGATIVE ng/mL (ref <100)
Cocaine Metabolite: NEGATIVE ng/mL (ref <150)
Desmethyltramadol: NEGATIVE ng/mL (ref <100)
Methamphetamine: NEGATIVE ng/mL (ref <250)
Opiates: NEGATIVE ng/mL (ref <100)
Oxycodone: NEGATIVE ng/mL (ref <100)
Tramadol: NEGATIVE ng/mL (ref <100)

## 2022-10-13 LAB — DM TEMPLATE

## 2022-10-14 ENCOUNTER — Other Ambulatory Visit: Payer: Self-pay | Admitting: Family Medicine

## 2022-10-14 ENCOUNTER — Telehealth: Payer: Self-pay

## 2022-10-14 MED ORDER — LISDEXAMFETAMINE DIMESYLATE 70 MG PO CAPS: 70.0000 mg | ORAL_CAPSULE | Freq: Every day | ORAL | 0 refills | Status: DC

## 2022-10-14 NOTE — Telephone Encounter (Signed)
Raphaelle Prevette (Key: [id number]  Lisdexamfetamine Dimesylate 70MG  capsules  Created: September 16th, 2024   Sent: September 16th, 2024  PA pending approval/denial

## 2022-10-14 NOTE — Telephone Encounter (Signed)
Approved. This drug is covered on the Preferred Drug List. It does not require prior approval.   Only specific National Drug Codes [facility]) are covered. The [facility] your pharmacy is using is not covered.   The pharmacy can use NDC(s): [id number]. You can get 30 capsules per 30 days. Please call the pharmacy to process the claim.

## 2022-10-21 ENCOUNTER — Other Ambulatory Visit: Payer: Self-pay | Admitting: Family Medicine

## 2022-10-29 DIAGNOSIS — Z419 Encounter for procedure for purposes other than remedying health state, unspecified: Secondary | ICD-10-CM | POA: Diagnosis not present

## 2022-11-01 ENCOUNTER — Other Ambulatory Visit: Payer: Self-pay | Admitting: Family Medicine

## 2022-11-04 MED ORDER — AMPHETAMINE-DEXTROAMPHET ER 30 MG PO CP24: 30.0000 mg | ORAL_CAPSULE | Freq: Every day | ORAL | 0 refills | Status: DC

## 2022-11-04 NOTE — Telephone Encounter (Signed)
Refill requested for [facility]all [facility] [address]  Next OV 10/11

## 2022-11-05 ENCOUNTER — Other Ambulatory Visit: Payer: Self-pay | Admitting: Family Medicine

## 2022-11-08 ENCOUNTER — Encounter: Payer: Self-pay | Admitting: Family Medicine

## 2022-11-08 ENCOUNTER — Telehealth (INDEPENDENT_AMBULATORY_CARE_PROVIDER_SITE_OTHER): Payer: Medicaid Other | Admitting: Family Medicine

## 2022-11-08 DIAGNOSIS — F988 Other specified behavioral and emotional disorders with onset usually occurring in childhood and adolescence: Secondary | ICD-10-CM

## 2022-11-08 DIAGNOSIS — F3342 Major depressive disorder, recurrent, in full remission: Secondary | ICD-10-CM | POA: Diagnosis not present

## 2022-11-08 MED ORDER — FLUOXETINE HCL 20 MG PO CAPS: 20.0000 mg | ORAL_CAPSULE | Freq: Every day | ORAL | 1 refills | Status: DC

## 2022-11-08 MED ORDER — AMPHETAMINE-DEXTROAMPHETAMINE 20 MG PO TABS: 20.0000 mg | ORAL_TABLET | Freq: Two times a day (BID) | ORAL | 0 refills | Status: DC

## 2022-11-08 MED ORDER — AMPHETAMINE-DEXTROAMPHET ER 30 MG PO CP24: 30.0000 mg | ORAL_CAPSULE | Freq: Every day | ORAL | 0 refills | Status: DC

## 2022-11-08 MED ORDER — VYVANSE 30 MG PO CAPS: 30.0000 mg | ORAL_CAPSULE | Freq: Every day | ORAL | 0 refills | Status: DC

## 2022-11-08 MED ORDER — CLONAZEPAM 0.5 MG PO TABS: 0.5000 mg | ORAL_TABLET | Freq: Two times a day (BID) | ORAL | 0 refills | Status: DC

## 2022-11-08 NOTE — Progress Notes (Signed)
Virtual Visit via Video Note  I connected with Jamie Tucker  on 11/08/22 at  2:00 PM EDT by a video enabled telemedicine application and verified that I am speaking with the correct person using two identifiers.  Location patient: [place] Location provider:work or home office Persons participating in the virtual visit: patient, provider  I discussed the limitations and requested verbal permission for telemedicine visit. The patient expressed understanding and agreed to proceed.  CC: 57 year old female being seen today for 1 month follow-up depression and anxiety. A/P as of last visit: "#1 adult ADD. Stable. Current regimen varies depending on when she gets up and takes her medication: Vyvanse 70 mg in the morning if she gets up early.  Other days if she gets up later she will take an Adderall XR but some days only Adderall plain. Review of online controlled substance prescribing database shows no red flags.  She is due for refill of her Vyvanse and I sent in #30 of the 70 mg tabs today. Her stimulants also serve as augmentation to her antidepressant regimen.   2 GAD, active recurrent major depressive disorder. Very difficult life circumstances right now--she lost her job and is currently searching for another. She wants to try prozac b/c she recalls it helping her in remote past, although she doesn't recall why she got off of it. Will start prozac 20 every day.  At the same time, start ween off venlafaxine: 37.5 tid x 5d then 37.5 bid x 5d then 37.5 once a day x 5d, then stop. Will refer to counseling that medicaid will cover.  She will research this and get back to me about referral."  INTERIM HX: She feels like she is doing great.  She recently moved to [place] near a friend.  The friend got her a job and she plans on moving to [place] soon to try to start fresh.  She has weaned off Effexor and onto fluoxetine without problem.  She continues to take Adderall and Vyvanse on a schedule according  to her anticipated daily need for duration and efficacy. Again, she states she never takes the Vyvanse on the same days as Adderall XR.    PMP AWARE reviewed today: most recent rx for Adderall 20 mg was filled 11/01/2022, # 60, rx by me. Most recent clonazepam prescription filled 10/28/2022, #60, prescription by me. Most recent Vyvanse 30 mg prescription filled 10/16/2022, #30, prescription by me. Most recent Adderall XR 30 mg filled 10/08/2022, #28, prescription by me. No red flags.  ROS: See pertinent positives and negatives per HPI.  Past Medical History:  Diagnosis Date   Abnormal mammogram of right breast    12/04/19->calcifications->f/u imaging "probably benign, rpt R br diag mammo 6 mo".   Adult ADHD    Alcoholism (HCC)    [facility] ICU admission 2007 for acute Alc intox,; also detox at [facility] 11/2005   Anxiety    Atypical chest pain 06/2009   Colon cancer screening    I-FOB neg 06/2020.   Depression    History of abnormal Pap smear 11/2006   ASC-US, High risk HPV negative.  Follow up paps normal.   History of ETT 06/2009   Normal/No ischemia   Low back pain    Pt reports L/S x-ray that showed deg arth in the past   Mild hyperlipidemia 12/2018   TLC   Tobacco dependence     Past Surgical History:  Procedure Laterality Date   BUNIONECTOMY  1997   Left foot   exercise  treadmill test  06/2009   No ischemia   TRANSTHORACIC ECHOCARDIOGRAM  11/2009   Normal except grade I diastolic dysfunction     Current Outpatient Medications:    amphetamine-dextroamphetamine (ADDERALL XR) 30 MG 24 hr capsule, Take 1 capsule (30 mg total) by mouth daily., Disp: 30 capsule, Rfl: 0   amphetamine-dextroamphetamine (ADDERALL) 20 MG tablet, Take 1 tablet (20 mg total) by mouth 2 (two) times daily., Disp: 60 tablet, Rfl: 0   clonazePAM (KLONOPIN) 0.5 MG tablet, Take 0.5 mg by mouth every 12 (twelve) hours., Disp: , Rfl:    fexofenadine (ALLEGRA) 180 MG tablet, Take 180 mg by mouth daily. itching,  Disp: , Rfl:    FLUoxetine (PROZAC) 20 MG capsule, Take 1 capsule (20 mg total) by mouth daily., Disp: 30 capsule, Rfl: 1   VYVANSE 30 MG capsule, Take 30 mg by mouth daily., Disp: , Rfl:    augmented betamethasone dipropionate (DIPROLENE-AF) 0.05 % cream, , Disp: , Rfl:    estradiol-norethindrone (ACTIVELLA) 1-0.5 MG tablet, Take 1 tablet by mouth daily. (Patient not taking: Reported on 11/08/2022), Disp: , Rfl:    fluticasone (FLONASE) 50 MCG/ACT nasal spray, Place 2 sprays into both nostrils daily. (Patient not taking: Reported on 11/16/2021), Disp: , Rfl:   EXAM:  VITALS per patient if applicable:      10/11/2022    1:46 PM 07/17/2022    1:38 PM 11/16/2021   10:21 AM  Vitals with BMI  Height 5\' 2"  5\' 2"  5' 2.25"  Weight 105 lbs 108 lbs 112 lbs  BMI 19.2 19.75 20.32  Systolic 110    Diastolic 74    Pulse 73      GENERAL: alert, oriented, appears well and in no acute distress  HEENT: atraumatic, conjunttiva clear, no obvious abnormalities on inspection of external nose and ears  NECK: normal movements of the head and neck  LUNGS: on inspection no signs of respiratory distress, breathing rate appears normal, no obvious gross SOB, gasping or wheezing  CV: no obvious cyanosis  MS: moves all visible extremities without noticeable abnormality  PSYCH/NEURO: pleasant and cooperative, no obvious depression or anxiety, speech and thought processing grossly intact  LABS: none today  ASSESSMENT AND PLAN:  Discussed the following assessment and plan:  #1 recurrent major depressive disorder and GAD. Significant improvement with switch over from venlafaxine to fluoxetine 20 mg a day. Continue this dosing and continue clonazepam 0.5 mg twice daily. Her stimulants also serve as augmentation to her antidepressant regimen.  #2 adult ADD.  Stable. Current regimen varies depending on when she gets up and takes her medication: Vyvanse 70 mg in the morning if she gets up early.  Other  days if she gets up later she will take an Adderall XR but some days only Adderall plain.  I discussed the assessment and treatment plan with the patient. The patient was provided an opportunity to ask questions and all were answered. The patient agreed with the plan and demonstrated an understanding of the instructions.   F/u: 3 mo  Signed:  Santiago Bumpers, MD           11/08/2022

## 2022-11-21 ENCOUNTER — Other Ambulatory Visit: Payer: Self-pay | Admitting: Family Medicine

## 2022-11-22 MED ORDER — AMPHETAMINE-DEXTROAMPHET ER 30 MG PO CP24: 30.0000 mg | ORAL_CAPSULE | Freq: Every day | ORAL | 0 refills | Status: DC

## 2022-11-22 NOTE — Telephone Encounter (Signed)
Last refill sent 11/08/22  Please fill, if appropriate.

## 2022-11-27 ENCOUNTER — Other Ambulatory Visit: Payer: Self-pay | Admitting: Family Medicine

## 2022-11-28 NOTE — Telephone Encounter (Signed)
Please advise. Pt is requesting a different medication than Adderall and states it no longer works for her

## 2022-11-28 NOTE — Telephone Encounter (Signed)
Already sent to provider

## 2022-11-29 DIAGNOSIS — Z419 Encounter for procedure for purposes other than remedying health state, unspecified: Secondary | ICD-10-CM | POA: Diagnosis not present

## 2022-12-03 ENCOUNTER — Other Ambulatory Visit: Payer: Self-pay | Admitting: Family Medicine

## 2022-12-03 NOTE — Telephone Encounter (Signed)
Please advise on request

## 2022-12-04 NOTE — Telephone Encounter (Signed)
Need to simplify medication regimen. Continue only with vyvanse 30 mg daily. No further adderall. -thx

## 2022-12-05 MED ORDER — AMPHETAMINE-DEXTROAMPHETAMINE 20 MG PO TABS: 20.0000 mg | ORAL_TABLET | Freq: Two times a day (BID) | ORAL | 0 refills | Status: DC

## 2022-12-13 ENCOUNTER — Other Ambulatory Visit: Payer: Self-pay | Admitting: Family Medicine

## 2022-12-16 NOTE — Telephone Encounter (Signed)
Refill requested for Vyvanse sent to [facility] on [address].

## 2022-12-23 MED ORDER — VYVANSE 30 MG PO CAPS: 30.0000 mg | ORAL_CAPSULE | Freq: Every day | ORAL | 0 refills | Status: DC

## 2022-12-23 NOTE — Telephone Encounter (Signed)
The pharmacy called due to confusion on medication. Pt picked up vyvanse 70 mg on 11/15 (30,0). She is currently prescribed 30 mg which was last picked up on 10/16 (30,0). A new Rx sent for 30 mg (30,0) on 11/25. Please advise on next step

## 2022-12-24 ENCOUNTER — Telehealth: Payer: Self-pay | Admitting: Family Medicine

## 2022-12-24 NOTE — Telephone Encounter (Signed)
Jamie Tucker has requested that any medications that are soon to be renewed/ all her medications  [facility] [place] [place]

## 2022-12-25 ENCOUNTER — Other Ambulatory Visit: Payer: Self-pay | Admitting: Family Medicine

## 2022-12-25 NOTE — Telephone Encounter (Signed)
Noted  

## 2022-12-27 ENCOUNTER — Encounter: Payer: Self-pay | Admitting: Family Medicine

## 2022-12-29 DIAGNOSIS — Z419 Encounter for procedure for purposes other than remedying health state, unspecified: Secondary | ICD-10-CM | POA: Diagnosis not present

## 2022-12-31 NOTE — Telephone Encounter (Signed)
Noted  

## 2022-12-31 NOTE — Telephone Encounter (Signed)
Adding on to this note. Christine with [facility] pharmacy called to inform us she did fill 4 pills of the   Vyvanse and wanted to document that. She is aware that Katilynn no longer medications sent to [facility] which is stated previously.

## 2023-01-01 MED ORDER — VYVANSE 30 MG PO CAPS: 30.0000 mg | ORAL_CAPSULE | Freq: Every day | ORAL | 0 refills | Status: DC

## 2023-01-01 NOTE — Telephone Encounter (Signed)
Rx sent 

## 2023-01-07 ENCOUNTER — Other Ambulatory Visit: Payer: Self-pay | Admitting: Family Medicine

## 2023-01-07 NOTE — Telephone Encounter (Signed)
Please advise on if appropriate. There is a lot of confusion on pt's medication. Appears pt is in transition with changing pharmacies. Would you like pt to schedule office visit for new contract and refills on all controlled meds?

## 2023-01-10 ENCOUNTER — Other Ambulatory Visit: Payer: Self-pay | Admitting: Family Medicine

## 2023-01-12 ENCOUNTER — Other Ambulatory Visit: Payer: Self-pay | Admitting: Family Medicine

## 2023-01-13 ENCOUNTER — Telehealth: Payer: Self-pay | Admitting: Family Medicine

## 2023-01-13 ENCOUNTER — Other Ambulatory Visit: Payer: Self-pay | Admitting: Family Medicine

## 2023-01-13 NOTE — Telephone Encounter (Signed)
noted 

## 2023-01-13 NOTE — Telephone Encounter (Signed)
Patient called and asked if Dr. Milinda Cave could give her a call to discuss medications. She did not want to provide details to me over the phone. I informed her I could relay that message but can not confirm that he will give her a call by end of the day.

## 2023-01-13 NOTE — Telephone Encounter (Signed)
Chania, Review of the [place] online controlled substance database shows that your prescription for Adderall 20 mg was filled 01/05/2023 at the [facility] on [address] (60 tabs).  This medication will not be eligible for refill until 02/04/2023.

## 2023-01-13 NOTE — Telephone Encounter (Signed)
Spoke with pt; Pt was very upset and crying on the phone. Pt stated she was in need of a new script/refill of amphetamine-dextroamphetamine (ADDERALL XR) 30 MG 24 hr capsule. I told pt it would be best for her to schedule an appointment with Dr. Milinda Cave to discuss possible script. She stated she could "not go another day like this" and asked if she could get something temporary to last until her appointment, which is now scheduled for tomorrow 12/17.

## 2023-01-14 ENCOUNTER — Encounter: Payer: Self-pay | Admitting: Family Medicine

## 2023-01-14 ENCOUNTER — Telehealth (INDEPENDENT_AMBULATORY_CARE_PROVIDER_SITE_OTHER): Payer: Medicaid Other | Admitting: Family Medicine

## 2023-01-14 DIAGNOSIS — F988 Other specified behavioral and emotional disorders with onset usually occurring in childhood and adolescence: Secondary | ICD-10-CM | POA: Diagnosis not present

## 2023-01-14 DIAGNOSIS — F332 Major depressive disorder, recurrent severe without psychotic features: Secondary | ICD-10-CM

## 2023-01-14 DIAGNOSIS — F411 Generalized anxiety disorder: Secondary | ICD-10-CM

## 2023-01-14 MED ORDER — AMPHETAMINE-DEXTROAMPHET ER 30 MG PO CP24: 30.0000 mg | ORAL_CAPSULE | Freq: Every day | ORAL | 0 refills | Status: AC

## 2023-01-14 MED ORDER — FLUOXETINE HCL 40 MG PO CAPS: 40.0000 mg | ORAL_CAPSULE | Freq: Every day | ORAL | 0 refills | Status: AC

## 2023-01-14 NOTE — Telephone Encounter (Signed)
Pt has appt today

## 2023-01-14 NOTE — Progress Notes (Signed)
Virtual Visit via Video Note  I connected with Annaliah  on 01/14/23 at 11:20 AM EST by a video enabled telemedicine application and verified that I am speaking with the correct person using two identifiers.  Location patient: [place] Location provider:work or home office Persons participating in the virtual visit: patient, provider  I discussed the limitations and requested verbal permission for telemedicine visit. The patient expressed understanding and agreed to proceed.   HPI: 57 year old female being seen today for follow-up adult ADD and major depressive disorder. I last saw her on October 11 this year and she was doing very well at that time.  She had switched over to fluoxetine 20 mg a day without problem and had completely gotten off Effexor.  She had recently gotten a job after moving to [place]. She was optimistic and hopeful.  She continued to take her stimulants and of variable manner depending on her needs/activities on a particular day.  Unfortunately her plans have not worked out.  She feels abandoned, has no job, is no longer moving to [place]. She says Adderall is the only thing that gets her motivated and up out of a severe depressed state for a while. She is moving back to [place] in 2 days. She feels distraught and alone.  She is not drinking.  PMP AWARE reviewed today: most recent rx for Adderall IR 20 mg tabs was filled 01/05/2023, # 60, rx by me. Most recent Vyvanse 30 mg Filled 01/01/2023, #30, prescription by me. Most recent Adderall XR 30 mg Was filled 11/22/2022, #30, prescription by me. Most recent clonazepam prescription filled 12/25/2022, #60, prescription by me. No red flags.  ROS: See pertinent positives and negatives per HPI.  Past Medical History:  Diagnosis Date   Abnormal mammogram of right breast    12/04/19->calcifications->f/u imaging "probably benign, rpt R br diag mammo 6 mo".   Adult ADHD    Alcoholism (HCC)    [facility] ICU admission  2007 for acute Alc intox,; also detox at [facility] 11/2005   Anxiety    Atypical chest pain 06/2009   Colon cancer screening    I-FOB neg 06/2020.   Depression    History of abnormal Pap smear 11/2006   ASC-US, High risk HPV negative.  Follow up paps normal.   History of ETT 06/2009   Normal/No ischemia   Low back pain    Pt reports L/S x-ray that showed deg arth in the past   Mild hyperlipidemia 12/2018   TLC   Tobacco dependence     Past Surgical History:  Procedure Laterality Date   BUNIONECTOMY  1997   Left foot   exercise treadmill test  06/2009   No ischemia   TRANSTHORACIC ECHOCARDIOGRAM  11/2009   Normal except grade I diastolic dysfunction     Current Outpatient Medications:    amphetamine-dextroamphetamine (ADDERALL XR) 30 MG 24 hr capsule, Take 1 capsule (30 mg total) by mouth daily., Disp: 30 capsule, Rfl: 0   amphetamine-dextroamphetamine (ADDERALL) 20 MG tablet, Take 1 tablet (20 mg total) by mouth 2 (two) times daily., Disp: 60 tablet, Rfl: 0   augmented betamethasone dipropionate (DIPROLENE-AF) 0.05 % cream, , Disp: , Rfl:    clonazePAM (KLONOPIN) 0.5 MG tablet, Take 1 tablet (0.5 mg total) by mouth every 12 (twelve) hours., Disp: 180 tablet, Rfl: 0   fexofenadine (ALLEGRA) 180 MG tablet, Take 180 mg by mouth daily. itching, Disp: , Rfl:    FLUoxetine (PROZAC) 20 MG capsule, Take 1 capsule (  20 mg total) by mouth daily., Disp: 90 capsule, Rfl: 1   VYVANSE 30 MG capsule, Take 1 capsule (30 mg total) by mouth daily., Disp: 30 capsule, Rfl: 0   estradiol-norethindrone (ACTIVELLA) 1-0.5 MG tablet, Take 1 tablet by mouth daily. (Patient not taking: Reported on 01/14/2023), Disp: , Rfl:    fluticasone (FLONASE) 50 MCG/ACT nasal spray, Place 2 sprays into both nostrils daily. (Patient not taking: Reported on 01/14/2023), Disp: , Rfl:   EXAM:  VITALS per patient if applicable:     10/11/2022    1:46 PM 07/17/2022    1:38 PM 11/16/2021   10:21 AM  Vitals with BMI  Height 5'  2" 5\' 2"  5' 2.25"  Weight 105 lbs 108 lbs 112 lbs  BMI 19.2 19.75 20.32  Systolic 110    Diastolic 74    Pulse 73       GENERAL: alert, oriented, appears well and in no acute distress  HEENT: atraumatic, conjunttiva clear, no obvious abnormalities on inspection of external nose and ears  NECK: normal movements of the head and neck  LUNGS: on inspection no signs of respiratory distress, breathing rate appears normal, no obvious gross SOB, gasping or wheezing  CV: no obvious cyanosis  MS: moves all visible extremities without noticeable abnormality  PSYCH/NEURO: pleasant and cooperative, no obvious depression or anxiety, speech and thought processing grossly intact  LABS: none today    Chemistry      Component Value Date/Time   NA 138 07/21/2020 1112   K 4.3 07/21/2020 1112   CL 105 07/21/2020 1112   CO2 25 07/21/2020 1112   BUN 15 07/21/2020 1112   CREATININE 0.70 07/21/2020 1112   CREATININE 0.62 10/02/2010 1507      Component Value Date/Time   CALCIUM 9.7 07/21/2020 1112   ALKPHOS 38 (L) 07/21/2020 1112   AST 12 07/21/2020 1112   ALT 9 07/21/2020 1112   BILITOT 0.3 07/21/2020 1112     ASSESSMENT AND PLAN:  Discussed the following assessment and plan:  #1 recurrent major depressive disorder, severe episode. Increase fluoxetine to 40 mg a day.  Therapeutic expectations and side effect profile of medication discussed today.  Patient's questions answered. I cautioned her about the use of Adderall as her way of picking herself up. In order to minimize problems with this medication I want her to get on a simplified regimen: Adderall XR 30 mg every day.  No further Adderall immediate release/short acting and no further Vyvanse. #30 Adderall XR sent in today.  2.  GAD. Increase fluoxetine to 40 mg a day. She will continue Klonopin 0.5 mg twice daily.   I discussed the assessment and treatment plan with the patient. The patient was provided an opportunity to ask  questions and all were answered. The patient agreed with the plan and demonstrated an understanding of the instructions.   F/u: 1 month  Signed:  Santiago Bumpers, MD           01/14/2023

## 2023-01-24 ENCOUNTER — Ambulatory Visit: Payer: Self-pay | Admitting: Family Medicine

## 2023-01-24 ENCOUNTER — Other Ambulatory Visit: Payer: Self-pay | Admitting: Family Medicine

## 2023-01-24 DIAGNOSIS — F411 Generalized anxiety disorder: Secondary | ICD-10-CM

## 2023-01-24 DIAGNOSIS — F988 Other specified behavioral and emotional disorders with onset usually occurring in childhood and adolescence: Secondary | ICD-10-CM

## 2023-01-24 MED ORDER — AMPHETAMINE-DEXTROAMPHETAMINE 20 MG PO TABS: 20.0000 mg | ORAL_TABLET | Freq: Two times a day (BID) | ORAL | 0 refills | Status: DC

## 2023-01-24 MED ORDER — CLONAZEPAM 0.5 MG PO TABS: 0.5000 mg | ORAL_TABLET | Freq: Two times a day (BID) | ORAL | 0 refills | Status: DC

## 2023-01-24 MED ORDER — AMPHETAMINE-DEXTROAMPHETAMINE 20 MG PO TABS: 20.0000 mg | ORAL_TABLET | Freq: Two times a day (BID) | ORAL | 0 refills | Status: AC

## 2023-01-24 NOTE — Telephone Encounter (Signed)
Patient Comment: Dr. Marvel Plan, I need a refill on Clonazapam and I made a mistake on the Adderall you prescribed. I can't take the Adderall Salts ext. release 30 mg. I get an upset stomach and my mouth gets too dry. We discussed only taking   1 prescription of Adderall and it should be the one that says Dextroamp-amphetamin 20 mg. tablet twice a day. That's the one that works. Can you please [facility] [place], [place] [address] [phone]. This is the only one I need to be on. Thank you.   Please advise

## 2023-01-24 NOTE — Telephone Encounter (Signed)
Copied from CRM [id number]. Topic: Clinical - Red Word Triage >> Jan 24, 2023  5:34 PM Clayton Bibles wrote: Red Word that prompted transfer to Nurse Triage: Jamie Tucker calling back about her meds that she needs to refill. Adderall 20 MG tablet and Klonopin. She has been out for 4 days. She is in tears and very upset.   Patient called tearful, stating that she is out of her medication. She states that she out of her 0.5 mg Klonopin and her 20 mg Adderall XR. The patient states that she was prescribed the wrong dose of Adderall and that she no longer has those pills. On call doctor who will look into the prescriptions and call the patient back. Patient advised of this and agreeable with this plan.

## 2023-01-24 NOTE — Telephone Encounter (Signed)
Pt called 3x today to get correct dosage of medication refilled. Pt is wanting refill on Klonopin 0.5mg  and Adderall 20mg . Pt is completely out. Dr ordered previously but didn't work for pt and she flushed down toilet. RN called [facility], but staff gone for the day. Pt is very upset over this as she tried two other times to get script. RN advised pt to go to ED if symptoms progress where she's unable to cope. Pt verbalized understanding.            Reason for Disposition  [1] Caller requesting a prescription renewal (no refills left), no triage required, AND [2] triager able to renew prescription per department policy  Answer Assessment - Initial Assessment Questions 1. DRUG NAME: "What medicine do you need to have refilled?"     Klonopin and Adderall  2. REFILLS REMAINING: "How many refills are remaining?" (Note: The label on the medicine or pill bottle will show how many refills are remaining. If there are no refills remaining, then a renewal may be needed.)     No refills  3. EXPIRATION DATE: "What is the expiration date?" (Note: The label states when the prescription will expire, and thus can no longer be refilled.)     na 4. PRESCRIBING HCP: "Who prescribed it?" Reason: If prescribed by specialist, call should be referred to that group.     [facility]  5. SYMPTOMS: "Do you have any symptoms?"     Anxious, feeling down  Protocols used: Medication Refill and Renewal Call-A-AH

## 2023-01-24 NOTE — Telephone Encounter (Signed)
Red Word that prompted transfer to Nurse Triage: Patient would like to know if correct dosage of adderall could be called in for her today.She stated that the salt tab brand upsets her stomach.She was crying on the phone stating that she is really having a hard time without this medication.She hasn't taken any in 4 days. Her depression has really been effecting her .

## 2023-01-24 NOTE — Telephone Encounter (Signed)
Answering service phone call at 5:56 PM. Notified by answering service nurse that patient has been out of Klonopin for 4 days as well as her Adderall.  She reportedly had received an Adderall prescription but it was the wrong dose so that was flushed down the toilet per discussion with previous nurse.  Chart reviewed including previous messages today.  Last visit with Dr. Milinda Cave on December 17.  Plan for Adderall was to simplify regimen to just Adderall XR 30 mg daily, no further Adderall immediate release/ short acting and no further Vyvanse.  #30 Adderall XR were sent in on 01/14/2023. Initially note indicated to continue Klonopin 0.5 mg twice daily, with previous prescription filled 12/25/2022.  #60.  I also reviewed controlled substance database.  Dextroamphetamine-amphetamine extended release 30 mg capsule filled for #30 on 01/14/2023, dextroamphetamine dextroamphetamine 20 mg #60 filled on 01/05/2023, Vyvanse 30 mg #30 filled on 12//24, clonazepam 0.5 mg #60 filled on 12/25/2022.  Previous clonazepam filled on 11/25/2022.  Called patient.  Denies any extra doses of klonopin - no more than twice per day. Has been out for 4 days. She is feeling down - depression symptoms, but no SI/HI. Based on last prescription refill date for clonazepam, should not be out if she is taking medication twice daily dosing.  Additionally discussed concern if she is out of the 20 mg Adderall.  She has been moving and reports that she may have misplaced some of her medications.  Refill message noted form earlier today to Dr. Milinda Cave regarding upset stomach and dry mouth with XR formulation of Adderrall XR.  Advised I will call in 5 days worth of meds - Klonopin 0.5mg  # 10 and adderall 20mg  BID #10 until she can discuss medication plan with her PCP - advised to contact them Monday, but I will also forward this note to Dr. Milinda Cave.   Discussed [phone] hotline or Behavioral Health eval if needed for worsening symptoms over  the weekend.  Understanding expressed.   Disregard initial printed prescriptions, those will be shred on Monday.  She did not have an address on file.  I called patient, entered her new address on file and prescriptions reordered electronically.

## 2023-01-27 MED ORDER — CLONAZEPAM 0.5 MG PO TABS: 0.5000 mg | ORAL_TABLET | Freq: Two times a day (BID) | ORAL | 0 refills | Status: AC

## 2023-01-27 NOTE — Telephone Encounter (Signed)
noted 

## 2023-01-27 NOTE — Telephone Encounter (Signed)
FYI I called pt today and discussed rx. I will detail our conversation in a telephone note.

## 2023-01-28 NOTE — Telephone Encounter (Signed)
 I called Jamie Tucker and discussed the medication situation in detail today. I will not prescribe any further stimulants for her. I will prescribe her 0.5 mg clonazepam  to use twice daily as needed.  She will get 60/month and we reviewed once again the rules of no early refills, no replacing lost prescriptions, and using only 1 designated pharmacy ([facility] [place] [facility] [address] From this point on she will have in person visits only. Next f/u within 1 mo from now, UDS and update CS at that time.  Signed:  Gerlene Hockey, MD           01/28/2023

## 2023-01-29 DIAGNOSIS — Z419 Encounter for procedure for purposes other than remedying health state, unspecified: Secondary | ICD-10-CM | POA: Diagnosis not present

## 2023-02-26 ENCOUNTER — Other Ambulatory Visit: Payer: Self-pay | Admitting: Family Medicine

## 2023-02-26 DIAGNOSIS — F988 Other specified behavioral and emotional disorders with onset usually occurring in childhood and adolescence: Secondary | ICD-10-CM

## 2023-02-26 NOTE — Telephone Encounter (Signed)
I am sorry but it is against medical board laws for me to prescribe a controlled substance outside of the state of [place].  She will have to find a primary care doctor or a psychiatrist there in [place] and establish care.

## 2023-02-27 NOTE — Telephone Encounter (Signed)
Per pt:  I have moved to [place], [place]. How does this process work when moving to another state? Do I just look for another doctor? I got here today and will be here close to a year. [facility] [address] [place], [place] [address]  [phone]

## 2023-02-27 NOTE — Telephone Encounter (Signed)
See previous message

## 2023-03-01 DIAGNOSIS — Z419 Encounter for procedure for purposes other than remedying health state, unspecified: Secondary | ICD-10-CM | POA: Diagnosis not present

## 2023-03-29 DIAGNOSIS — Z419 Encounter for procedure for purposes other than remedying health state, unspecified: Secondary | ICD-10-CM | POA: Diagnosis not present

## 2023-04-11 ENCOUNTER — Other Ambulatory Visit: Payer: Self-pay | Admitting: Family Medicine

## 2023-04-29 DEATH — deceased

## 2023-05-10 DIAGNOSIS — Z419 Encounter for procedure for purposes other than remedying health state, unspecified: Secondary | ICD-10-CM | POA: Diagnosis not present

## 2023-06-09 DIAGNOSIS — Z419 Encounter for procedure for purposes other than remedying health state, unspecified: Secondary | ICD-10-CM | POA: Diagnosis not present

## 2023-07-10 DIAGNOSIS — Z419 Encounter for procedure for purposes other than remedying health state, unspecified: Secondary | ICD-10-CM | POA: Diagnosis not present

## 2023-08-09 DIAGNOSIS — Z419 Encounter for procedure for purposes other than remedying health state, unspecified: Secondary | ICD-10-CM | POA: Diagnosis not present

## 2023-09-09 DIAGNOSIS — Z419 Encounter for procedure for purposes other than remedying health state, unspecified: Secondary | ICD-10-CM | POA: Diagnosis not present

## 2023-10-10 DIAGNOSIS — Z419 Encounter for procedure for purposes other than remedying health state, unspecified: Secondary | ICD-10-CM | POA: Diagnosis not present
